# Patient Record
Sex: Female | Born: 1955 | Race: White | Hispanic: No | Marital: Married | Smoking: Never smoker
Health system: Southern US, Community
[De-identification: ages and names within clinical notes are randomized; demographics above are authoritative.]

## PROBLEM LIST (undated history)

## (undated) DIAGNOSIS — E669 Obesity, unspecified: Secondary | ICD-10-CM

## (undated) DIAGNOSIS — N393 Stress incontinence (female) (male): Secondary | ICD-10-CM

## (undated) DIAGNOSIS — D649 Anemia, unspecified: Secondary | ICD-10-CM

## (undated) DIAGNOSIS — Z803 Family history of malignant neoplasm of breast: Secondary | ICD-10-CM

## (undated) DIAGNOSIS — E119 Type 2 diabetes mellitus without complications: Secondary | ICD-10-CM

## (undated) DIAGNOSIS — C4490 Unspecified malignant neoplasm of skin, unspecified: Secondary | ICD-10-CM

## (undated) DIAGNOSIS — L570 Actinic keratosis: Secondary | ICD-10-CM

## (undated) HISTORY — PX: COLONOSCOPY: SHX174

## (undated) HISTORY — PX: SKIN BIOPSY: SHX1

## (undated) HISTORY — DX: Unspecified malignant neoplasm of skin, unspecified: C44.90

## (undated) HISTORY — DX: Anemia, unspecified: D64.9

## (undated) HISTORY — PX: CRYOTHERAPY: SHX1416

## (undated) HISTORY — PX: ESOPHAGOGASTRODUODENOSCOPY: SHX1529

## (undated) HISTORY — DX: Actinic keratosis: L57.0

## (undated) HISTORY — DX: Obesity, unspecified: E66.9

## (undated) HISTORY — PX: TUBAL LIGATION: SHX77

## (undated) HISTORY — DX: Type 2 diabetes mellitus without complications: E11.9

## (undated) HISTORY — DX: Stress incontinence (female) (male): N39.3

## (undated) HISTORY — DX: Family history of malignant neoplasm of breast: Z80.3

---

## 2004-10-13 ENCOUNTER — Other Ambulatory Visit: Payer: Self-pay

## 2004-10-14 ENCOUNTER — Ambulatory Visit: Payer: Self-pay | Admitting: Unknown Physician Specialty

## 2009-11-30 ENCOUNTER — Ambulatory Visit: Payer: Self-pay | Admitting: Internal Medicine

## 2009-12-08 ENCOUNTER — Ambulatory Visit: Payer: Self-pay | Admitting: Internal Medicine

## 2009-12-31 ENCOUNTER — Ambulatory Visit: Payer: Self-pay | Admitting: Internal Medicine

## 2010-01-30 ENCOUNTER — Ambulatory Visit: Payer: Self-pay | Admitting: Internal Medicine

## 2010-03-02 ENCOUNTER — Ambulatory Visit: Payer: Self-pay | Admitting: Internal Medicine

## 2010-04-01 ENCOUNTER — Ambulatory Visit: Payer: Self-pay | Admitting: Internal Medicine

## 2010-05-02 ENCOUNTER — Ambulatory Visit: Payer: Self-pay | Admitting: Internal Medicine

## 2010-06-29 ENCOUNTER — Ambulatory Visit: Payer: Self-pay | Admitting: Internal Medicine

## 2010-07-01 ENCOUNTER — Ambulatory Visit: Payer: Self-pay | Admitting: Internal Medicine

## 2010-08-10 ENCOUNTER — Ambulatory Visit: Payer: Self-pay | Admitting: Internal Medicine

## 2010-08-31 ENCOUNTER — Ambulatory Visit: Payer: Self-pay | Admitting: Internal Medicine

## 2010-10-06 ENCOUNTER — Ambulatory Visit: Payer: Self-pay | Admitting: Internal Medicine

## 2010-10-31 ENCOUNTER — Ambulatory Visit: Payer: Self-pay | Admitting: Internal Medicine

## 2010-12-01 ENCOUNTER — Ambulatory Visit: Payer: Self-pay | Admitting: Internal Medicine

## 2011-03-22 ENCOUNTER — Ambulatory Visit: Payer: Self-pay | Admitting: Internal Medicine

## 2011-04-02 ENCOUNTER — Ambulatory Visit: Payer: Self-pay | Admitting: Internal Medicine

## 2011-07-12 ENCOUNTER — Ambulatory Visit: Payer: Self-pay | Admitting: Oncology

## 2011-08-01 ENCOUNTER — Ambulatory Visit: Payer: Self-pay | Admitting: Oncology

## 2011-11-15 ENCOUNTER — Ambulatory Visit: Payer: Self-pay | Admitting: Oncology

## 2011-12-01 ENCOUNTER — Ambulatory Visit: Payer: Self-pay | Admitting: Oncology

## 2013-12-13 DIAGNOSIS — Z148 Genetic carrier of other disease: Secondary | ICD-10-CM | POA: Insufficient documentation

## 2013-12-13 DIAGNOSIS — I10 Essential (primary) hypertension: Secondary | ICD-10-CM | POA: Insufficient documentation

## 2013-12-26 ENCOUNTER — Ambulatory Visit: Payer: Self-pay | Admitting: Oncology

## 2014-01-16 ENCOUNTER — Ambulatory Visit: Payer: Self-pay | Admitting: Oncology

## 2014-01-30 ENCOUNTER — Ambulatory Visit: Payer: Self-pay | Admitting: Oncology

## 2014-02-14 LAB — HM PAP SMEAR: HM Pap smear: NEGATIVE

## 2014-03-02 ENCOUNTER — Ambulatory Visit: Payer: Self-pay | Admitting: Oncology

## 2014-07-11 ENCOUNTER — Ambulatory Visit: Payer: Self-pay | Admitting: Gastroenterology

## 2017-03-30 ENCOUNTER — Ambulatory Visit: Payer: Self-pay | Admitting: Obstetrics & Gynecology

## 2017-04-05 ENCOUNTER — Ambulatory Visit (INDEPENDENT_AMBULATORY_CARE_PROVIDER_SITE_OTHER): Payer: 59 | Admitting: Obstetrics & Gynecology

## 2017-04-05 ENCOUNTER — Encounter: Payer: Self-pay | Admitting: Obstetrics & Gynecology

## 2017-04-05 VITALS — BP 120/80 | HR 69 | Ht 61.0 in | Wt 322.0 lb

## 2017-04-05 DIAGNOSIS — Z124 Encounter for screening for malignant neoplasm of cervix: Secondary | ICD-10-CM | POA: Diagnosis not present

## 2017-04-05 DIAGNOSIS — Z1211 Encounter for screening for malignant neoplasm of colon: Secondary | ICD-10-CM | POA: Diagnosis not present

## 2017-04-05 DIAGNOSIS — Z1239 Encounter for other screening for malignant neoplasm of breast: Secondary | ICD-10-CM

## 2017-04-05 DIAGNOSIS — Z1231 Encounter for screening mammogram for malignant neoplasm of breast: Secondary | ICD-10-CM | POA: Diagnosis not present

## 2017-04-05 DIAGNOSIS — Z0000 Encounter for general adult medical examination without abnormal findings: Secondary | ICD-10-CM | POA: Insufficient documentation

## 2017-04-05 DIAGNOSIS — Z01419 Encounter for gynecological examination (general) (routine) without abnormal findings: Secondary | ICD-10-CM

## 2017-04-05 LAB — HM PAP SMEAR: HM PAP: NORMAL

## 2017-04-05 LAB — HM MAMMOGRAPHY

## 2017-04-05 NOTE — Patient Instructions (Signed)
PAP every three years Mammogram every year    Call [phone] to schedule at [facility] every 10 years Labs yearly (with PCP)

## 2017-04-05 NOTE — Progress Notes (Signed)
HPI:      [name] is a 61 y.o. [phone] who LMP was in the past, she presents today for her annual examination.  The patient has no complaints today. The patient is not sexually active. Herlast pap: approximate date 2015 and was normal and last mammogram: approximate date 2017 and was normal.  The patient does perform self breast exams.  There is notable family history of breast or ovarian cancer in her family. She has tested NEG for BRCA and other genes. The patient is not currently taking hormone replacement therapy. Patient denies post-menopausal vaginal bleeding.   The patient has regular exercise: no. The patient denies current symptoms of depression.   Pt has some urinary freq and urgency, feels it is from diuretic  GYN Hx: Last Colonoscopy:2 years ago. Normal.  Last DEXA: 3 years ago.    PMHx: DM HTN Obesity Anemia PSH: Ablation BTL FH: Breast Uterine Cancer Social History   Tobacco Use  . Smoking status: Never Smoker  . Smokeless tobacco: Never Used  Substance Use Topics  . Alcohol use: No    Frequency: Never  . Drug use: No    Current Outpatient Medications:  .  allopurinol (ZYLOPRIM) 100 MG tablet, TAKE 1 TABLET BY MOUTH TWO  TIMES DAILY, Disp: , Rfl:  .  atenolol (TENORMIN) 50 MG tablet, TAKE 1 TABLET BY MOUTH  DAILY, Disp: , Rfl:  .  lisinopril-hydrochlorothiazide (PRINZIDE,ZESTORETIC) 10-12.5 MG tablet, TAKE 1 TABLET BY MOUTH  DAILY, Disp: , Rfl:  .  fluorouracil (EFUDEX) 5 % cream, , Disp: , Rfl:  .  ibuprofen (ADVIL,MOTRIN) 200 MG tablet, Take by mouth., Disp: , Rfl:  Allergies: Patient has no known allergies.  Review of Systems  Constitutional: Negative for chills, fever and malaise/fatigue.  HENT: Negative for congestion, sinus pain and sore throat.   Eyes: Negative for blurred vision and pain.  Respiratory: Negative for cough and wheezing.   Cardiovascular: Negative for chest pain and leg swelling.  Gastrointestinal: Negative for abdominal  pain, constipation, diarrhea, heartburn, nausea and vomiting.  Genitourinary: Negative for dysuria, frequency, hematuria and urgency.  Musculoskeletal: Negative for back pain, joint pain, myalgias and neck pain.  Skin: Negative for itching and rash.  Neurological: Negative for dizziness, tremors and weakness.  Endo/Heme/Allergies: Does not bruise/bleed easily.  Psychiatric/Behavioral: Negative for depression. The patient is not nervous/anxious and does not have insomnia.     Objective: BP 120/80   Pulse 69   Ht _0  (1.549 m)   Wt (!) 322 lb (146.1 kg)   BMI 60.84 kg/m   Filed Weights   04/05/17 0859  Weight: (!) 322 lb (146.1 kg)   Body mass index is 60.84 kg/m. Physical Exam  Constitutional: She is oriented to person, place, and time. She appears well-developed and well-nourished. No distress.  Obesity limits exam  Genitourinary: Rectum normal, vagina normal and uterus normal. Pelvic exam was performed with patient supine. There is no rash or lesion on the right labia. There is no rash or lesion on the left labia. Vagina exhibits no lesion. No bleeding in the vagina. Right adnexum does not display mass and does not display tenderness. Left adnexum does not display mass and does not display tenderness. Cervix does not exhibit motion tenderness, lesion, friability or polyp.   Uterus is mobile and midaxial. Uterus is not enlarged or exhibiting a mass.  HENT:  Head: Normocephalic and atraumatic. Head is without laceration.  Right Ear: Hearing normal.  Left Ear: Hearing  normal.  Nose: No epistaxis.  No foreign bodies.  Mouth/Throat: Uvula is midline, oropharynx is clear and moist and mucous membranes are normal.  Eyes: Pupils are equal, round, and reactive to light.  Neck: Normal range of motion. Neck supple. No thyromegaly present.  Cardiovascular: Normal rate and regular rhythm. Exam reveals no gallop and no friction rub.  No murmur heard. Pulmonary/Chest: Effort normal and  breath sounds normal. No respiratory distress. She has no wheezes. Right breast exhibits no mass, no skin change and no tenderness. Left breast exhibits no mass, no skin change and no tenderness.  Abdominal: Soft. Bowel sounds are normal. She exhibits no distension. There is no tenderness. There is no rebound.  Musculoskeletal: Normal range of motion.  Neurological: She is alert and oriented to person, place, and time. No cranial nerve deficit.  Skin: Skin is warm and dry.  Psychiatric: She has a normal mood and affect. Judgment normal.  Vitals reviewed.   Assessment: Annual Exam 1. Annual physical exam   2. Screening for cervical cancer   3. Screen for colon cancer   4. Screening for breast cancer   5. Morbid obesity ([place])    Plan:            1.  Cervical Screening-  Pap smear done today  2. Breast screening- Exam annually and mammogram scheduled  3. Colonoscopy every 10 years, Hemoccult testing after age 59  4. Labs managed by PCP  5. Counseling for hormonal therapy: none, no change in therapy today  6. Monitor OAB sx's    F/U  Return in about 1 year (around 04/05/2018) for Annual.  Barnett Applebaum, MD, [facility], [facility] 04/05/2017  9:24 AM

## 2017-04-07 LAB — IGP, APTIMA HPV
HPV APTIMA: NEGATIVE
PAP SMEAR COMMENT: 0

## 2017-04-25 LAB — FECAL OCCULT BLOOD, IMMUNOCHEMICAL: Fecal Occult Bld: NEGATIVE

## 2017-08-30 DIAGNOSIS — C4491 Basal cell carcinoma of skin, unspecified: Secondary | ICD-10-CM

## 2017-08-30 HISTORY — DX: Basal cell carcinoma of skin, unspecified: C44.91

## 2018-04-09 ENCOUNTER — Ambulatory Visit (INDEPENDENT_AMBULATORY_CARE_PROVIDER_SITE_OTHER): Payer: Managed Care, Other (non HMO) | Admitting: Advanced Practice Midwife

## 2018-04-09 ENCOUNTER — Encounter: Payer: Self-pay | Admitting: Advanced Practice Midwife

## 2018-04-09 VITALS — BP 132/88 | Ht 61.0 in | Wt 291.0 lb

## 2018-04-09 DIAGNOSIS — Z0000 Encounter for general adult medical examination without abnormal findings: Secondary | ICD-10-CM

## 2018-04-09 DIAGNOSIS — Z01419 Encounter for gynecological examination (general) (routine) without abnormal findings: Secondary | ICD-10-CM | POA: Diagnosis not present

## 2018-04-09 NOTE — Patient Instructions (Signed)
American Heart Association (AHA) Exercise Recommendation  Being physically active is important to prevent heart disease and stroke, the nation's No. 1and No. 5killers. To improve overall cardiovascular health, we suggest at least 150 minutes per week of moderate exercise or 75 minutes per week of vigorous exercise (or a combination of moderate and vigorous activity). Thirty minutes a day, five times a week is an easy goal to remember. You will also experience benefits even if you divide your time into two or three segments of 10 to 15 minutes per day.  For people who would benefit from lowering their blood pressure or cholesterol, we recommend 40 minutes of aerobic exercise of moderate to vigorous intensity three to four times a week to lower the risk for heart attack and stroke.  Physical activity is anything that makes you move your body and burn calories.  This includes things like climbing stairs or playing sports. Aerobic exercises benefit your heart, and include walking, jogging, swimming or biking. Strength and stretching exercises are best for overall stamina and flexibility.  The simplest, positive change you can make to effectively improve your heart health is to start walking. It's enjoyable, free, easy, social and great exercise. A walking program is flexible and boasts high success rates because people can stick with it. It's easy for walking to become a regular and satisfying part of life.   For Overall Cardiovascular Health:  At least 30 minutes of moderate-intensity aerobic activity at least 5 days per week for a total of 150  OR   At least 25 minutes of vigorous aerobic activity at least 3 days per week for a total of 75 minutes; or a combination of moderate- and vigorous-intensity aerobic activity  AND   Moderate- to high-intensity muscle-strengthening activity at least 2 days per week for additional health benefits.  For Lowering Blood Pressure and Cholesterol  An  average 40 minutes of moderate- to vigorous-intensity aerobic activity 3 or 4 times per week  What if I can't make it to the time goal? Something is always better than nothing! And everyone has to start somewhere. Even if you've been sedentary for years, today is the day you can begin to make healthy changes in your life. If you don't think you'll make it for 30 or 40 minutes, set a reachable goal for today. You can work up toward your overall goal by increasing your time as you get stronger. Don't let all-or-nothing thinking rob you of doing what you can every day.  Source:http://www.heart.org   Mediterranean Diet A Mediterranean diet refers to food and lifestyle choices that are based on the traditions of countries located on the [employer]. This way of eating has been shown to help prevent certain conditions and improve outcomes for people who have chronic diseases, like kidney disease and heart disease. What are tips for following this plan? Lifestyle  Cook and eat meals together with your family, when possible.  Drink enough fluid to keep your urine clear or pale yellow.  Be physically active every day. This includes: ? Aerobic exercise like running or swimming. ? Leisure activities like gardening, walking, or housework.  Get 7-8 hours of sleep each night.  If recommended by your health care provider, drink red wine in moderation. This means 1 glass a day for nonpregnant women and 2 glasses a day for men. A glass of wine equals 5 oz (150 mL). Reading food labels  Check the serving size of packaged foods. For foods such as rice  and pasta, the serving size refers to the amount of cooked product, not dry.  Check the total fat in packaged foods. Avoid foods that have saturated fat or trans fats.  Check the ingredients list for added sugars, such as corn syrup. Shopping  At the grocery store, buy most of your food from the areas near the walls of the store. This  includes: ? Fresh fruits and vegetables (produce). ? Grains, beans, nuts, and seeds. Some of these may be available in unpackaged forms or large amounts (in bulk). ? Fresh seafood. ? Poultry and eggs. ? Low-fat dairy products.  Buy whole ingredients instead of prepackaged foods.  Buy fresh fruits and vegetables in-season from local farmers markets.  Buy frozen fruits and vegetables in resealable bags.  If you do not have access to quality fresh seafood, buy precooked frozen shrimp or canned fish, such as tuna, salmon, or sardines.  Buy small amounts of raw or cooked vegetables, salads, or olives from the deli or salad bar at your store.  Stock your pantry so you always have certain foods on hand, such as olive oil, canned tuna, canned tomatoes, rice, pasta, and beans. Cooking  Cook foods with extra-virgin olive oil instead of using butter or other vegetable oils.  Have meat as a side dish, and have vegetables or grains as your main dish. This means having meat in small portions or adding small amounts of meat to foods like pasta or stew.  Use beans or vegetables instead of meat in common dishes like chili or lasagna.  Experiment with different cooking methods. Try roasting or broiling vegetables instead of steaming or sauteing them.  Add frozen vegetables to soups, stews, pasta, or rice.  Add nuts or seeds for added healthy fat at each meal. You can add these to yogurt, salads, or vegetable dishes.  Marinate fish or vegetables using olive oil, lemon juice, garlic, and fresh herbs. Meal planning  Plan to eat 1 vegetarian meal one day each week. Try to work up to 2 vegetarian meals, if possible.  Eat seafood 2 or more times a week.  Have healthy snacks readily available, such as: ? Vegetable sticks with hummus. ? [place] yogurt. ? Fruit and nut trail mix.  Eat balanced meals throughout the week. This includes: ? Fruit: 2-3 servings a day ? Vegetables: 4-5 servings a  day ? Low-fat dairy: 2 servings a day ? Fish, poultry, or lean meat: 1 serving a day ? Beans and legumes: 2 or more servings a week ? Nuts and seeds: 1-2 servings a day ? Whole grains: 6-8 servings a day ? Extra-virgin olive oil: 3-4 servings a day  Limit red meat and sweets to only a few servings a month What are my food choices?  Mediterranean diet ? Recommended ? Grains: Whole-grain pasta. Brown rice. Bulgar wheat. Polenta. Couscous. Whole-wheat bread. [place]. ? Vegetables: Artichokes. Beets. Broccoli. Cabbage. Carrots. Eggplant. Green beans. Chard. Kale. Spinach. Onions. Leeks. Peas. Squash. Tomatoes. Peppers. Radishes. ? Fruits: Apples. Apricots. Avocado. Berries. Bananas. Cherries. Dates. Figs. Grapes. Lemons. Melon. Oranges. Peaches. Plums. Pomegranate. ? Meats and other protein foods: Beans. Almonds. Sunflower seeds. Pine nuts. Peanuts. [place]. Salmon. Scallops. Shrimp. [place]. Tilapia. Clams. Oysters. Eggs. ? Dairy: Low-fat milk. Cheese. Greek yogurt. ? Beverages: Water. Red wine. Herbal tea. ? Fats and oils: Extra virgin olive oil. Avocado oil. Grape seed oil. ? Sweets and desserts: [place] yogurt with honey. Baked apples. Poached pears. Trail mix. ? Seasoning and other foods: Basil. Cilantro. Coriander.  Cumin. Mint. Parsley. Sage. Rosemary. Tarragon. Garlic. Oregano. Thyme. Pepper. Balsalmic vinegar. Tahini. Hummus. Tomato sauce. Olives. Mushrooms. ? Limit these ? Grains: Prepackaged pasta or rice dishes. Prepackaged cereal with added sugar. ? Vegetables: Deep fried potatoes (french fries). ? Fruits: Fruit canned in syrup. ? Meats and other protein foods: Beef. Pork. Lamb. Poultry with skin. Hot dogs. [place] Salines. ? Dairy: Ice cream. Sour cream. Whole milk. ? Beverages: Juice. Sugar-sweetened soft drinks. Beer. Liquor and spirits. ? Fats and oils: Butter. Canola oil. Vegetable oil. Beef fat (tallow). Lard. ? Sweets and desserts: Cookies. Cakes. Pies. Candy. ? Seasoning and other  foods: Mayonnaise. Premade sauces and marinades. ? The items listed may not be a complete list. Talk with your dietitian about what dietary choices are right for you. Summary  The Mediterranean diet includes both food and lifestyle choices.  Eat a variety of fresh fruits and vegetables, beans, nuts, seeds, and whole grains.  Limit the amount of red meat and sweets that you eat.  Talk with your health care provider about whether it is safe for you to drink red wine in moderation. This means 1 glass a day for nonpregnant women and 2 glasses a day for men. A glass of wine equals 5 oz (150 mL). This information is not intended to replace advice given to you by your health care provider. Make sure you discuss any questions you have with your health care provider. Document Released: 12/10/2015 Document Revised: 01/12/2016 Document Reviewed: 12/10/2015 Elsevier Interactive Patient Education  2018 [place] Years, Female Preventive care refers to lifestyle choices and visits with your health care provider that can promote health and wellness. What does preventive care include?  A yearly physical exam. This is also called an annual well check.  Dental exams once or twice a year.  Routine eye exams. Ask your health care provider how often you should have your eyes checked.  Personal lifestyle choices, including: ? Daily care of your teeth and gums. ? Regular physical activity. ? Eating a healthy diet. ? Avoiding tobacco and drug use. ? Limiting alcohol use. ? Practicing safe sex. ? Taking low-dose aspirin daily starting at age 71. ? Taking vitamin and mineral supplements as recommended by your health care provider. What happens during an annual well check? The services and screenings done by your health care provider during your annual well check will depend on your age, overall health, lifestyle risk factors, and family history of disease. Counseling Your health  care provider may ask you questions about your:  Alcohol use.  Tobacco use.  Drug use.  Emotional well-being.  Home and relationship well-being.  Sexual activity.  Eating habits.  Work and work Statistician.  Method of birth control.  Menstrual cycle.  Pregnancy history.  Screening You may have the following tests or measurements:  Height, weight, and BMI.  Blood pressure.  Lipid and cholesterol levels. These may be checked every 5 years, or more frequently if you are over 55 years old.  Skin check.  Lung cancer screening. You may have this screening every year starting at age 34 if you have a 30-pack-year history of smoking and currently smoke or have quit within the past 15 years.  Fecal occult blood test (FOBT) of the stool. You may have this test every year starting at age 28.  Flexible sigmoidoscopy or colonoscopy. You may have a sigmoidoscopy every 5 years or a colonoscopy every 10 years starting at age 82.  Hepatitis C blood test.  Hepatitis B blood test.  Sexually transmitted disease (STD) testing.  Diabetes screening. This is done by checking your blood sugar (glucose) after you have not eaten for a while (fasting). You may have this done every 1-3 years.  Mammogram. This may be done every 1-2 years. Talk to your health care provider about when you should start having regular mammograms. This may depend on whether you have a family history of breast cancer.  BRCA-related cancer screening. This may be done if you have a family history of breast, ovarian, tubal, or peritoneal cancers.  Pelvic exam and Pap test. This may be done every 3 years starting at age 56. Starting at age 51, this may be done every 5 years if you have a Pap test in combination with an HPV test.  Bone density scan. This is done to screen for osteoporosis. You may have this scan if you are at high risk for osteoporosis.  Discuss your test results, treatment options, and if necessary,  the need for more tests with your health care provider. Vaccines Your health care provider may recommend certain vaccines, such as:  Influenza vaccine. This is recommended every year.  Tetanus, diphtheria, and acellular pertussis (Tdap, Td) vaccine. You may need a Td booster every 10 years.  Varicella vaccine. You may need this if you have not been vaccinated.  Zoster vaccine. You may need this after age 23.  Measles, mumps, and rubella (MMR) vaccine. You may need at least one dose of MMR if you were born in 1957 or later. You may also need a second dose.  Pneumococcal 13-valent conjugate (PCV13) vaccine. You may need this if you have certain conditions and were not previously vaccinated.  Pneumococcal polysaccharide (PPSV23) vaccine. You may need one or two doses if you smoke cigarettes or if you have certain conditions.  Meningococcal vaccine. You may need this if you have certain conditions.  Hepatitis A vaccine. You may need this if you have certain conditions or if you travel or work in places where you may be exposed to hepatitis A.  Hepatitis B vaccine. You may need this if you have certain conditions or if you travel or work in places where you may be exposed to hepatitis B.  Haemophilus influenzae type b (Hib) vaccine. You may need this if you have certain conditions.  Talk to your health care provider about which screenings and vaccines you need and how often you need them. This information is not intended to replace advice given to you by your health care provider. Make sure you discuss any questions you have with your health care provider. Document Released: 05/15/2015 Document Revised: 01/06/2016 Document Reviewed: 02/17/2015 Elsevier Interactive Patient Education  [employer].

## 2018-04-09 NOTE — Progress Notes (Signed)
Patient ID: Rose Mendez, female   DOB: 1955-07-24, 62 y.o.   MRN: [id number]      Gynecology Annual Exam  PCP: Juluis Pitch, MD  Chief Complaint:  Chief Complaint  Patient presents with  . Annual Exam    History of Present Illness:Patient is a 62 y.o. [id number] presents for annual exam. The patient has no complaints today. She has increased her healthy lifestyle in the past year and is down 31 pounds from then. She is following a decreased carb diet and she walks on the treadmill daily at work. She also does water aerobics 2x per week. She notices an improvement in how she feels since making healthy lifestyle changes. She just had her mammogram this morning.   LMP: No LMP recorded. Postmenopausal She denies any bleeding Postcoital Bleeding: not applicable   The patient is not sexually active. She denies dyspareunia.  The patient does perform self breast exams.  There is notable family history of breast or ovarian cancer in her family. Her maternal aunt was diagnosed with breast cancer in her 46s. She has 2 other maternal aunts with breast cancer diagnosed in their 17s. The patient tested negative for BRCA gene last year.   The patient wears seatbelts: yes.   The patient has regular exercise: yes.    The patient denies current symptoms of depression.     Review of Systems: Review of Systems  Constitutional: Negative.   HENT: Negative.   Eyes: Negative.   Respiratory: Negative.   Cardiovascular: Negative.   Gastrointestinal: Negative.   Genitourinary: Negative.   Musculoskeletal: Negative.   Skin: Negative.   Neurological: Negative.   Endo/Heme/Allergies: Negative.   Psychiatric/Behavioral: Negative.     Past Medical History:  Past Medical History:  Diagnosis Date  . Anemia   . Diabetes type 2, controlled ([place])   . Obesity   . Skin cancer   . Stress incontinence     Past Surgical History:  Past Surgical History:  Procedure Laterality Date  . COLONOSCOPY    .  CRYOTHERAPY    . ESOPHAGOGASTRODUODENOSCOPY    . SKIN BIOPSY    . TUBAL LIGATION      Gynecologic History:  No LMP recorded. Last Pap: 1 year ago Results were:  no abnormalities  Last mammogram: 1 year ago Results were: BI-RAD I per patient report She reports a normal bone density exam in the past  Obstetric History: [id number]  Family History:  Family History  Problem Relation Age of Onset  . Uterine cancer Mother   . Breast cancer Maternal Aunt   . Lung cancer Maternal Aunt     Social History:  Social History   Socioeconomic History  . Marital status: Married    Spouse name: Not on file  . Number of children: Not on file  . Years of education: Not on file  . Highest education level: Not on file  Occupational History  . Not on file  Social Needs  . Financial resource strain: Not on file  . Food insecurity:    Worry: Not on file    Inability: Not on file  . Transportation needs:    Medical: Not on file    Non-medical: Not on file  Tobacco Use  . Smoking status: Never Smoker  . Smokeless tobacco: Never Used  Substance and Sexual Activity  . Alcohol use: No    Frequency: Never  . Drug use: No  . Sexual activity: Not Currently    Birth control/protection: None  Lifestyle  . Physical activity:    Days per week: Not on file    Minutes per session: Not on file  . Stress: Not on file  Relationships  . Social connections:    Talks on phone: Not on file    Gets together: Not on file    Attends religious service: Not on file    Active member of club or organization: Not on file    Attends meetings of clubs or organizations: Not on file    Relationship status: Not on file  . Intimate partner violence:    Fear of current or ex partner: Not on file    Emotionally abused: Not on file    Physically abused: Not on file    Forced sexual activity: Not on file  Other Topics Concern  . Not on file  Social History Narrative  . Not on file    Allergies:  No Known  Allergies  Medications: Prior to Admission medications   Medication Sig Start Date End Date Taking? Authorizing Provider  atenolol (TENORMIN) 50 MG tablet TAKE 1 TABLET BY MOUTH  DAILY 12/06/16  Yes [provider]  lisinopril-hydrochlorothiazide (PRINZIDE,ZESTORETIC) 10-12.5 MG tablet TAKE 1 TABLET BY MOUTH  DAILY 03/27/17  Yes [provider]  allopurinol (ZYLOPRIM) 100 MG tablet TAKE 1 TABLET BY MOUTH TWO  TIMES DAILY 02/29/16   [provider]  fluorouracil (EFUDEX) 5 % cream  01/03/17   [provider]  ibuprofen (ADVIL,MOTRIN) 200 MG tablet Take by mouth.    [provider]    Physical Exam Vitals: Blood pressure 132/88, height '5\' 1"'  (1.549 m), weight 291 lb (132 kg).  General: NAD HEENT: normocephalic, anicteric Thyroid: no enlargement, no palpable nodules Pulmonary: No increased work of breathing, CTAB Cardiovascular: RRR, distal pulses 2+ Breast: Breast symmetrical, no tenderness, no palpable nodules or masses, no skin or nipple retraction present, no nipple discharge.  No axillary or supraclavicular lymphadenopathy. Abdomen: NABS, soft, non-tender, non-distended.  Umbilicus without lesions.  No hepatomegaly, splenomegaly or masses palpable. No evidence of hernia  Genitourinary: deferred for no concerns/PAP interval/shared decision making Extremities: no edema, erythema, or tenderness Neurologic: Grossly intact Psychiatric: mood appropriate, affect full     Assessment: 62 y.o. [id number] routine annual exam  Plan: Problem List Items Addressed This Visit    None    Visit Diagnoses    Well woman exam without gynecological exam    -  Primary      1) Mammogram - recommend yearly screening mammogram.  Mammogram Is up to date  2) STI screening  was offered and declined  3) ASCCP guidelines and rational discussed.  Patient opts for every 5 years screening interval  4) Osteoporosis  - per USPTF routine screening DEXA at age  32   Consider FDA-approved medical therapies in postmenopausal women and men aged 31 years and older, based on the following: a) A hip or vertebral (clinical or morphometric) fracture b) T-score ? -2.5 at the femoral neck or spine after appropriate evaluation to exclude secondary causes C) Low bone mass (T-score between -1.0 and -2.5 at the femoral neck or spine) and a 10-year probability of a hip fracture ? 3% or a 10-year probability of a major osteoporosis-related fracture ? 20% based on the [place]-adapted WHO algorithm   5) Routine healthcare maintenance including cholesterol, diabetes screening discussed managed by PCP  6) Colonoscopy: normal colonoscopy 1 or 2 years ago.  Screening recommended starting at age 5 for average risk individuals,  age 64 for individuals deemed at increased risk (including African Americans) and recommended to continue until age 2.  For patient age 39-85 individualized approach is recommended.  Gold standard screening is via colonoscopy, Cologuard screening is an acceptable alternative for patient unwilling or unable to undergo colonoscopy.  "Colorectal cancer screening for average?risk adults: 2018 guideline update from the [place]: A Cancer Journal for Clinicians: Sep 28, 2016   7) Return in about 1 year (around 04/10/2019) for annual established gyn.    Rod Can, CNM [facility], [facility] 04/09/2018, 10:35 AM

## 2018-04-11 ENCOUNTER — Encounter: Payer: Self-pay | Admitting: Obstetrics and Gynecology

## 2019-04-09 ENCOUNTER — Other Ambulatory Visit: Payer: Self-pay

## 2019-04-09 DIAGNOSIS — Z20822 Contact with and (suspected) exposure to covid-19: Secondary | ICD-10-CM

## 2019-04-11 LAB — NOVEL CORONAVIRUS, NAA: SARS-CoV-2, NAA: DETECTED — AB

## 2019-04-12 ENCOUNTER — Ambulatory Visit: Payer: Managed Care, Other (non HMO) | Admitting: Obstetrics & Gynecology

## 2019-05-10 ENCOUNTER — Other Ambulatory Visit: Payer: Self-pay

## 2019-05-10 ENCOUNTER — Encounter: Payer: Self-pay | Admitting: Obstetrics & Gynecology

## 2019-05-10 ENCOUNTER — Ambulatory Visit (INDEPENDENT_AMBULATORY_CARE_PROVIDER_SITE_OTHER): Payer: Managed Care, Other (non HMO) | Admitting: Obstetrics & Gynecology

## 2019-05-10 VITALS — BP 120/80 | Ht 61.5 in | Wt 311.0 lb

## 2019-05-10 DIAGNOSIS — Z1231 Encounter for screening mammogram for malignant neoplasm of breast: Secondary | ICD-10-CM

## 2019-05-10 DIAGNOSIS — Z1211 Encounter for screening for malignant neoplasm of colon: Secondary | ICD-10-CM

## 2019-05-10 DIAGNOSIS — Z01419 Encounter for gynecological examination (general) (routine) without abnormal findings: Secondary | ICD-10-CM | POA: Diagnosis not present

## 2019-05-10 NOTE — Progress Notes (Signed)
HPI:      [name] is a 64 y.o. [phone] who LMP was in the past, she presents today for her annual examination.  The patient has no complaints today. The patient is not currently sexually active.  Husband passed away May 05, 2019 from [place].  Son died 12-03-18.  SO tress this year.   Herlast pap: approximate date 2018 and was normal and last mammogram: approximate date 2019 and was normal.  The patient does perform self breast exams.  There is notable family history of uterine cancer in her family.  BRCA and LYNCH Neg. The patient is not taking hormone replacement therapy. Patient denies post-menopausal vaginal bleeding.   The patient has regular exercise: yes. The patient denies current symptoms of depression.    GYN Hx: Last Colonoscopy:3 years ago. Normal.  Last DEXA: never ago.    PMHx: Past Medical History:  Diagnosis Date  . Anemia   . Diabetes type 2, controlled ([place])   . Family history of breast cancer    12/19 pt states she and aunts are BRCA neg  . Obesity   . Skin cancer   . Stress incontinence    Past Surgical History:  Procedure Laterality Date  . COLONOSCOPY    . CRYOTHERAPY    . ESOPHAGOGASTRODUODENOSCOPY    . SKIN BIOPSY    . TUBAL LIGATION     Family History  Problem Relation Age of Onset  . Uterine cancer Mother   . Breast cancer Maternal Aunt   . Lung cancer Maternal Aunt   . Breast cancer Maternal Aunt 62   Social History   Tobacco Use  . Smoking status: Never Smoker  . Smokeless tobacco: Never Used  Substance Use Topics  . Alcohol use: No  . Drug use: No    Current Outpatient Medications:  .  allopurinol (ZYLOPRIM) 100 MG tablet, TAKE 1 TABLET BY MOUTH TWO  TIMES DAILY, Disp: , Rfl:  .  atenolol (TENORMIN) 50 MG tablet, TAKE 1 TABLET BY MOUTH  DAILY, Disp: , Rfl:  .  ibuprofen (ADVIL,MOTRIN) 200 MG tablet, Take by mouth., Disp: , Rfl:  .  lisinopril-hydrochlorothiazide (PRINZIDE,ZESTORETIC) 10-12.5 MG tablet, TAKE 1 TABLET BY MOUTH  DAILY,  Disp: , Rfl:  .  fluorouracil (EFUDEX) 5 % cream, , Disp: , Rfl:  Allergies: Patient has no known allergies.  Review of Systems  Constitutional: Negative for chills, fever and malaise/fatigue.  HENT: Negative for congestion, sinus pain and sore throat.   Eyes: Negative for blurred vision and pain.  Respiratory: Negative for cough and wheezing.   Cardiovascular: Negative for chest pain and leg swelling.  Gastrointestinal: Negative for abdominal pain, constipation, diarrhea, heartburn, nausea and vomiting.  Genitourinary: Negative for dysuria, frequency, hematuria and urgency.  Musculoskeletal: Negative for back pain, joint pain, myalgias and neck pain.  Skin: Negative for itching and rash.  Neurological: Negative for dizziness, tremors and weakness.  Endo/Heme/Allergies: Does not bruise/bleed easily.  Psychiatric/Behavioral: Negative for depression. The patient is not nervous/anxious and does not have insomnia.     Objective: BP 120/80   Ht 5' 1.5" (1.562 m)   Wt (!) 311 lb (141.1 kg)   BMI 57.81 kg/m   Filed Weights   05/10/19 0855  Weight: (!) 311 lb (141.1 kg)   Body mass index is 57.81 kg/m. Physical Exam Constitutional:      General: She is not in acute distress.    Appearance: She is well-developed. She is obese.  Genitourinary:  Pelvic exam was performed with patient supine.     Vagina, uterus and rectum normal.     No lesions in the vagina.     No vaginal bleeding.     No cervical motion tenderness, friability, lesion or polyp.     Uterus is mobile.     Uterus is not enlarged.     No uterine mass detected.    Uterus is midaxial.     No right or left adnexal mass present.     Right adnexa not tender.     Left adnexa not tender.     Genitourinary Comments: Limited exam due to obesity  HENT:     Head: Normocephalic and atraumatic. No laceration.     Right Ear: Hearing normal.     Left Ear: Hearing normal.     Mouth/Throat:     Pharynx: Uvula midline.    Eyes:     Pupils: Pupils are equal, round, and reactive to light.  Neck:     Thyroid: No thyromegaly.  Cardiovascular:     Rate and Rhythm: Normal rate and regular rhythm.     Heart sounds: No murmur. No friction rub. No gallop.   Pulmonary:     Effort: Pulmonary effort is normal. No respiratory distress.     Breath sounds: Normal breath sounds. No wheezing.  Chest:     Breasts:        Right: No mass, skin change or tenderness.        Left: No mass, skin change or tenderness.  Abdominal:     General: Bowel sounds are normal. There is no distension.     Palpations: Abdomen is soft.     Tenderness: There is no abdominal tenderness. There is no rebound.  Musculoskeletal:        General: Normal range of motion.     Cervical back: Normal range of motion and neck supple.  Neurological:     Mental Status: She is alert and oriented to person, place, and time.     Cranial Nerves: No cranial nerve deficit.  Skin:    General: Skin is warm and dry.  Psychiatric:        Judgment: Judgment normal.  Vitals reviewed.     Assessment: Annual Exam 1. Women's annual routine gynecological examination   2. Encounter for screening mammogram for malignant neoplasm of breast   3. Screen for colon cancer     Plan:            1.  Cervical Screening-  Pap smear schedule reviewed with patient  2. Breast screening- Exam annually and mammogram scheduled  3. Colonoscopy every 10 years, Hemoccult testing after age 74  4. Labs managed by PCP  5. Counseling for hormonal therapy: none              6. FRAX - FRAX score for assessing the 10 year probability for fracture calculated and discussed today.  Based on age and score today, DEXA is not currently scheduled.    F/U  Return in about 1 year (around 05/09/2020) for Annual.  Barnett Applebaum, MD, [facility], [facility] 05/10/2019  9:28 AM

## 2019-05-10 NOTE — Patient Instructions (Addendum)
PAP every three years Mammogram every year    at [facility] Colonoscopy every 10 years Labs yearly (with PCP)

## 2019-05-16 LAB — FECAL OCCULT BLOOD, IMMUNOCHEMICAL: Fecal Occult Bld: NEGATIVE

## 2019-06-26 DIAGNOSIS — C4492 Squamous cell carcinoma of skin, unspecified: Secondary | ICD-10-CM

## 2019-06-26 DIAGNOSIS — D099 Carcinoma in situ, unspecified: Secondary | ICD-10-CM

## 2019-06-26 HISTORY — DX: Squamous cell carcinoma of skin, unspecified: C44.92

## 2019-06-26 HISTORY — DX: Carcinoma in situ, unspecified: D09.9

## 2019-07-13 DIAGNOSIS — D099 Carcinoma in situ, unspecified: Secondary | ICD-10-CM

## 2019-07-20 ENCOUNTER — Ambulatory Visit: Payer: Self-pay | Attending: Internal Medicine

## 2019-07-20 DIAGNOSIS — Z23 Encounter for immunization: Secondary | ICD-10-CM

## 2019-07-20 NOTE — Progress Notes (Signed)
   Covid-19 Vaccination Clinic  Name:  Rose Mendez    MRN: [id number] DOB: 1956/04/26  07/20/2019  Rose Mendez was observed post Covid-19 immunization for 15 minutes without incident. She was provided with Vaccine Information Sheet and instruction to access the V-Safe system.   Rose Mendez was instructed to call 911 with any severe reactions post vaccine: Marland Kitchen Difficulty breathing  . Swelling of face and throat  . A fast heartbeat  . A bad rash all over body  . Dizziness and weakness   Immunizations Administered    Name Date Dose VIS Date Route   Moderna COVID-19 Vaccine 07/20/2019  8:30 AM 0.5 mL 04/02/2019 Intramuscular   Manufacturer: Moderna   Lot: [id number]   [id number]

## 2019-07-26 ENCOUNTER — Ambulatory Visit: Payer: Managed Care, Other (non HMO) | Admitting: Dermatology

## 2019-07-26 ENCOUNTER — Encounter: Payer: Self-pay | Admitting: Dermatology

## 2019-07-26 ENCOUNTER — Other Ambulatory Visit: Payer: Self-pay

## 2019-07-26 DIAGNOSIS — L578 Other skin changes due to chronic exposure to nonionizing radiation: Secondary | ICD-10-CM

## 2019-07-26 DIAGNOSIS — C44629 Squamous cell carcinoma of skin of left upper limb, including shoulder: Secondary | ICD-10-CM | POA: Diagnosis not present

## 2019-07-26 DIAGNOSIS — D0461 Carcinoma in situ of skin of right upper limb, including shoulder: Secondary | ICD-10-CM | POA: Diagnosis not present

## 2019-07-26 DIAGNOSIS — C4491 Basal cell carcinoma of skin, unspecified: Secondary | ICD-10-CM

## 2019-07-26 DIAGNOSIS — D099 Carcinoma in situ, unspecified: Secondary | ICD-10-CM

## 2019-07-26 MED ORDER — CALCIPOTRIENE 0.005 % EX CREA: TOPICAL_CREAM | CUTANEOUS | 1 refills | Status: DC

## 2019-07-26 MED ORDER — FLUOROURACIL 5 % EX CREA: TOPICAL_CREAM | CUTANEOUS | 1 refills | Status: DC

## 2019-07-26 NOTE — Patient Instructions (Addendum)
Wound Care Instructions  On the day following your surgery, you should begin doing daily dressing changes: 1. Remove the old dressing and discard it. 2. Cleanse the wound gently with tap water. This may be done in the shower or by placing a wet gauze pad directly on the wound and letting it soak for several minutes. 3. It is important to gently remove any dried blood from the wound in order to encourage healing. This may be done by gently rolling a moistened Q-tip on the dried blood. Do not pick at the wound. 4. If the wound should start to bleed, continue cleaning the wound, then place a moist gauze pad on the wound and hold pressure for a few minutes.  5. Make sure you then dry the skin surrounding the wound completely or the tape will not stick to the skin. Do not use cotton balls on the wound. 6. After the wound is clean and dry, apply the ointment gently with a Q-tip. 7. Cut a non-stick pad to fit the size of the wound. Lay the pad flush to the wound. If the wound is draining, you may want to reinforce it with a small amount of gauze on top of the non-stick pad for a little added compression to the area. 8. Use the tape to seal the area completely. 9. Select from the following with respect to your individual situation: 1. If your wound has been stitched closed: continue the above steps 1-8 at least daily until your sutures are removed. 2. If your wound has been left open to heal: continue steps 1-8 at least daily for the first 3-4 weeks. 10. We would like for you to take a few extra precautions for at least the next week. 1. Sleep with your head elevated on pillows if our wound is on your head. 2. Do not bend over or lift heavy items to reduce the chance of elevated blood pressure to the wound 3. Do not participate in particularly strenuous activities.   Below is a list of dressing supplies you might need.  . Cotton-tipped applicators - Q-tips . Gauze pads (2x2 and/or 4x4) - All-Purpose  Sponges . Non-stick dressing material - Telfa . Tape - Paper or Hypafix . New and clean tube of petroleum jelly - Vaseline    Comments on Post-Operative Period 1. Slight swelling and redness often appear around the wound. This is normal and will disappear within several days following the surgery. 2. The healing wound will drain a brownish-red-yellow discharge during healing. This is a normal phase of wound healing. As the wound begins to heal, the drainage may increase in amount. Again, this drainage is normal. 3. Notify us if the drainage becomes persistently bloody, excessively swollen, or intensely painful or develops a foul odor or red streaks.  4. If you should experience mild discomfort during the healing phase, you may take an aspirin-free medication such as Tylenol (acetaminophen). Notify us if the discomfort is severe or persistent. Avoid alcoholic beverages when taking pain medicine.  In Case of Wound Hemorrhage A wound hemorrhage is when the bandage suddenly becomes soaked with bright red blood and flows profusely. If this happens, sit down or lie down with your head elevated. If the wound has a dressing on it, do not remove the dressing. Apply pressure to the existing gauze. If the wound is not covered, use a gauze pad to apply pressure and continue applying the pressure for 20 minutes without peeking. DO NOT COVER THE WOUND WITH   A LARGE TOWEL OR [place] CLOTH. Release your hand from the wound site but do not remove the dressing. If the bleeding has stopped, gently clean around the wound. Leave the dressing in place for 24 hours if possible. This wait time allows the blood vessels to close off so that you do not spark a new round of bleeding by disrupting the newly clotted blood vessels with an immediate dressing change. If the bleeding does not subside, continue to hold pressure. If matters are out of your control, contact an After Hours clinic or go to the Emergency Room.   Start  nicotinamide 500 mg each night and Heliocare Advanced each morning to reduce risk of skin cancer  Recommend broad spectrum SPF 30 or higher and photoprotection daily   5-FLUOROURACIL TREATMENT INSTRUCTIONS  Actinic keratoses are the dry, red scaly spots on the skin caused by sun damage. A portion of these spots can turn into skin cancer with time, and treating them can help prevent development of skin cancer.   Treatment of these spots requires removal of the defective skin cells. There are various ways to remove actinic keratoses, including freezing with liquid nitrogen, treatment with creams, or treatment with a blue light procedure in the office.   5-fluorouracil cream is a topical cream used to treat actinic keratoses. It works by interfering with the growth of abnormal fast-growing skin cells, such as actinic keratoses. These cells peel off and are replaced by healthy ones.   5-fluorouracil/calcipotriene is a combination of the 5-fluorouracil cream with a vitamin D analog cream called calcipotriene. The calcipotriene alone does not treat actinic keratoses. However, when it is combined with 5-fluorouracil, it helps the 5-fluorouracil treat the actinic keratoses much faster so that the same results can be achieved with a much shorter treatment time.  INSTRUCTIONS FOR 5-FLUOROURACIL/CALCIPOTRIENE CREAM:   5-fluorouracil/calcipotriene cream typically only needs to be used for 4-7 days. A thin layer should be applied twice a day to the treatment areas recommended by your physician.   If your physician prescribed you separate tubes of 5-fluourouracil and calcipotriene, apply a thin layer of 5-fluorouracil followed by a thin layer of calcipotriene.   Avoid contact with your eyes, nostrils, and mouth. Do not use 5-fluorouracil/calcipotriene cream on infected or open wounds.   You will develop redness, irritation and some crusting at areas where you have pre-cancer damage/actinic keratoses. IF YOU  DEVELOP PAIN, BLEEDING, OR SIGNIFICANT CRUSTING, STOP THE TREATMENT EARLY - you have already gotten a good response and the actinic keratoses should clear up well.  Wash your hands after applying 5-fluorouracil 5% cream on your skin.   A moisturizer or sunscreen with a minimum SPF 30 should be applied each morning.   Once you have finished the treatment, you can apply a thin layer of Vaseline twice a day to irritated areas to soothe and calm the areas more quickly. If you experience significant discomfort, contact your physician.  For some patients it is necessary to repeat the treatment for best results.  SIDE EFFECTS: When using 5-fluorouracil/calcipotriene cream, you may have mild irritation, such as redness, dryness, swelling, or a mild burning sensation. This usually resolves within 2 weeks. The more actinic keratoses you have, the more redness and inflammation you can expect during treatment. Eye irritation has been reported rarely. If this occurs, please let us know.  If you have any trouble using this cream, please call the office. If you have any other questions about this information, please do not hesitate  to ask me before you leave the office.

## 2019-07-26 NOTE — Progress Notes (Signed)
Follow-Up Visit   Subjective  Rose Mendez is a 64 y.o. female who presents for the following: Skin Cancer (1 month f/u R distal lat thigh, R arm, L shin) and Actinic Keratosis (1 month f/u arms, legs ).  She is scheduled for excision of SCC next week.   She has a BCC and 2 SCCis to treat today.  The following portions of the chart were reviewed this encounter and updated as appropriate: Tobacco  Allergies  Meds  Problems  Med Hx  Surg Hx  Fam Hx      Review of Systems: No other skin or systemic complaints.  Objective  Well appearing patient in no apparent distress; mood and affect are within normal limits.  A focused examination was performed including left shoulder, bilateral forearms and hands. Relevant physical exam findings are noted in the Assessment and Plan.  Objective  bilateral arms and hands: Diffuse scaly erythematous macules with underlying dyspigmentation.   Objective  Right dorsum of hand: Keratotic pink plaque.   Objective  Right Forearm - Anterior: Keratotic pink plaque.   Objective  Left shoulder: Pink pearly plaque with arborizing vessels.   Assessment & Plan  Actinic skin damage bilateral arms and hands  Extensive with a history of multiple non-melanoma skin cancers. Start Heliocare advanced each morning and nicotinamide 500 mg each night to reduce risk of skin cancer.  Plan 5FU/calcipotriene. Hold until after [facility] sites heal and after Mohs surgery performed. Risks and benefits reviewed. Expected reaction discussed.  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  Destruction of lesion  Destruction method: electrodesiccation and curettage   Informed consent: discussed and consent obtained   Timeout:  patient name, date of birth, surgical site, and procedure verified Procedure prep:   Patient was prepped and draped in usual sterile fashion Prep type:  Isopropyl alcohol Anesthesia: the lesion was anesthetized in a standard fashion   Anesthetic:  1% lidocaine w/ epinephrine 1-100,000 buffered w/ 8.4% NaHCO3 Curettage performed in three different directions: Yes   Electrodesiccation performed over the curetted area: Yes   Curettage cycles:  3 Lesion length (cm):  0.9 Lesion width (cm):  0.8 Margin per side (cm):  0.2 Final wound size (cm):  1.3 Hemostasis achieved with:  pressure and electrodesiccation Outcome: patient tolerated procedure well with no complications   Post-procedure details: wound care instructions given   Post-procedure details comment:  Mupirocin and pressure dressing applied  Right Forearm - Anterior  Destruction of lesion  Destruction method: electrodesiccation and curettage   Informed consent: discussed and consent obtained   Timeout:  patient name, date of birth, surgical site, and procedure verified Anesthesia: the lesion was anesthetized in a standard fashion   Anesthetic:  1% lidocaine w/ epinephrine 1-100,000 buffered w/ 8.4% NaHCO3 Curettage performed in three different directions: Yes   Electrodesiccation performed over the curetted area: Yes   Curettage cycles:  3 Lesion length (cm):  0.8 Lesion width (cm):  0.8 Margin per side (cm):  0.2 Final wound size (cm):  1.2 Hemostasis achieved with:  electrodesiccation Outcome: patient tolerated procedure well with no complications   Post-procedure details: wound care instructions given   Post-procedure details comment:  Mupirocin and a bandage applied  Superficial basal cell carcinoma (BCC) Left shoulder  Destruction of lesion  Destruction method: electrodesiccation and curettage   Informed consent:  discussed and consent obtained   Timeout:  patient name, date of birth, surgical site, and procedure verified Anesthesia: the lesion was anesthetized in a standard fashion   Anesthetic:   1% lidocaine w/ epinephrine 1-100,000 buffered w/ 8.4% NaHCO3 Curettage performed in three different directions: Yes   Electrodesiccation performed over the curetted area: Yes   Curettage cycles:  3 Lesion length (cm):  1.7 Lesion width (cm):  1.5 Margin per side (cm):  0.2 Final wound size (cm):  2.1 Hemostasis achieved with:  electrodesiccation Outcome: patient tolerated procedure well with no complications   Post-procedure details: wound care instructions given   Post-procedure details comment:  Mupirocin and pressure bandage applied  Return in about 5 days (around 07/31/2019) for surgery.   Documentation: I have reviewed the above documentation for accuracy and completeness, and I agree with the above.  Forest Gleason, MD

## 2019-07-31 ENCOUNTER — Other Ambulatory Visit: Payer: Self-pay

## 2019-07-31 ENCOUNTER — Ambulatory Visit: Payer: Managed Care, Other (non HMO) | Admitting: Dermatology

## 2019-07-31 DIAGNOSIS — C44529 Squamous cell carcinoma of skin of other part of trunk: Secondary | ICD-10-CM | POA: Diagnosis not present

## 2019-07-31 DIAGNOSIS — C4492 Squamous cell carcinoma of skin, unspecified: Secondary | ICD-10-CM

## 2019-07-31 MED ORDER — MUPIROCIN 2 % EX OINT: 1.0000 "application " | TOPICAL_OINTMENT | Freq: Every day | CUTANEOUS | 0 refills | Status: DC

## 2019-07-31 NOTE — Progress Notes (Signed)
   Follow-Up Visit   Subjective  Rose Mendez is a 64 y.o. female who presents for the following: Procedure (Excision of SCC on chest).   The following portions of the chart were reviewed this encounter and updated as appropriate: Tobacco  Allergies  Meds  Problems  Med Hx  Surg Hx  Fam Hx      Review of Systems: No other skin or systemic complaints.  Objective  Well appearing patient in no apparent distress; mood and affect are within normal limits.  A focused examination was performed including chest. Relevant physical exam findings are noted in the Assessment and Plan.  Objective  Right chest: Pink papule  Assessment & Plan  Squamous cell carcinoma of skin Right chest  Skin excision  Lesion length (cm):  0.8 Lesion width (cm):  0.8 Margin per side (cm):  0.5 Total excision diameter (cm):  1.8 Informed consent: discussed and consent obtained   Timeout: patient name, date of birth, surgical site, and procedure verified   Procedure prep:  Patient was prepped and draped in usual sterile fashion Prep type:  Chlorhexidine Anesthesia: the lesion was anesthetized in a standard fashion   Anesthetic:  1% lidocaine w/ epinephrine 1-100,000 nerve block (16cc) Instrument used comment:  15c Hemostasis achieved with: pressure and electrodesiccation   Outcome: patient tolerated procedure well with no complications    Skin repair Complexity:  Complex Final length (cm):  1.5 Informed consent: discussed and consent obtained   Timeout: patient name, date of birth, surgical site, and procedure verified   Procedure prep:  Patient was prepped and draped in usual sterile fashion Prep type:  Chlorhexidine Anesthesia: the lesion was anesthetized in a standard fashion   Anesthetic:  1% lidocaine w/ epinephrine 1-100,000 local infiltration Reason for type of repair: reduce tension to allow closure, reduce the risk of dehiscence, infection, and necrosis, allow closure of the large  defect, allow side-to-side closure without requiring a flap or graft and enhance both functionality and cosmetic results   Undermining: area extensively undermined   Subcutaneous layers (deep stitches):  Suture size:  3-0 Suture type: Vicryl (polyglactin 910)   Stitches:  Buried vertical mattress (pursestring closure) Fine/surface layer approximation (top stitches):  Suture size:  4-0 Suture type: Prolene (polypropylene)   Stitches: simple running   Hemostasis achieved with: suture, pressure and electrodesiccation Outcome: patient tolerated procedure well with no complications   Post-procedure details: wound care instructions given   Additional details:  Extensive undermining greater than the maximum width of the defect along at least one entire edge of the defect was performed Maximum width of defect perpendicular to line of the closure 1.5 cm Width of undermining done 1.5 cm  Mupirocin and a pressure dressing applied  mupirocin ointment (BACTROBAN) 2 %  Specimen 1 - Surgical pathology Differential Diagnosis: r/o Residual SCC Check Margins: Yes Keratotic pink papule/nodule or plaque.  Start mupirocin ointment qd with dressing changes.  Return in about 9 days (around 08/09/2019) for suture removal.   Graciella Belton, RMA, am acting as scribe for Forest Gleason, MD .  Documentation: I have reviewed the above documentation for accuracy and completeness, and I agree with the above.  Forest Gleason, MD

## 2019-07-31 NOTE — Patient Instructions (Signed)
Wound Care Instructions for After Surgery  On the day following your surgery, you should begin doing daily dressing changes until your sutures are removed: Remove the bandage. Cleanse the wound gently with soap and water.  Make sure you then dry the skin surrounding the wound completely or the tape will not stick to the skin. Do not use cotton balls on the wound. After the wound is clean and dry, apply the ointment (either prescription antibiotic prescribed by your doctor or plain Vaseline if nothing was prescribed) gently with a Q-tip. If you are using a bandaid to cover: Apply a bandaid large enough to cover the entire wound. If you do not have a bandaid large enough to cover the wound OR if you are sensitive to bandaid adhesive: Cut a non-stick pad (such as Telfa) to fit the size of the wound.  Cover the wound with the non-stick pad. If the wound is draining, you may want to add a small amount of gauze on top of the non-stick pad for a little added compression to the area. Use tape to seal the area completely.  For the next 1-2 weeks: Be sure to keep the wound moist with ointment 24/7 to ensure best healing. If you are unable to cover the wound with a bandage to hold the ointment in place, you may need to reapply the ointment several times a day. Do not bend over or lift heavy items to reduce the chance of elevated blood pressure to the wound. Do not participate in particularly strenuous activities.  Below is a list of dressing supplies you might need.  Cotton-tipped applicators - Q-tips Gauze pads (2x2 and/or 4x4) - [employer]-Purpose Sponges New and clean tube of petroleum jelly (Vaseline) OR prescription antibiotic ointment if prescribed Either a bandaid large enough to cover the entire wound OR non-stick dressing material (Telfa) and Tape (Paper or Hypafix)  FOR ADULT SURGERY PATIENTS: If you need something for pain relief, you may take 1 extra strength Tylenol (acetaminophen) and 2  ibuprofen (200 mg) together every 4 hours as needed. (Do not take these medications if you are allergic to them or if you know you cannot take them for any other reason). Typically you may only need pain medication for 1-3 days.   Comments on the Post-Operative Period Slight swelling and redness often appear around the wound. This is normal and will disappear within several days following the surgery. The healing wound will drain a brownish-red-yellow discharge during healing. This is a normal phase of wound healing. As the wound begins to heal, the drainage may increase in amount. Again, this drainage is normal. Notify us if the drainage becomes persistently bloody, excessively swollen, or intensely painful or develops a foul odor or red streaks.  The healing wound will also typically be itchy. This is normal. If you have severe or persistent pain, Notify us if the discomfort is severe or persistent. Avoid alcoholic beverages when taking pain medicine.  In Case of Wound Hemorrhage A wound hemorrhage is when the bandage suddenly becomes soaked with bright red blood and flows profusely. If this happens, sit down or lie down with your head elevated. If the wound has a dressing on it, do not remove the dressing. Apply pressure to the existing gauze. If the wound is not covered, use a gauze pad to apply pressure and continue applying the pressure for 20 minutes without peeking. DO NOT COVER THE WOUND WITH A LARGE TOWEL OR WASH CLOTH. Release your hand from the   wound site but do not remove the dressing. If the bleeding has stopped, gently clean around the wound. Leave the dressing in place for 24 hours if possible. This wait time allows the blood vessels to close off so that you do not spark a new round of bleeding by disrupting the newly clotted blood vessels with an immediate dressing change. If the bleeding does not subside, continue to hold pressure for 40 minutes. If bleeding continues, page your  physician, contact an After Hours clinic or go to the Emergency Room.   

## 2019-08-05 NOTE — Progress Notes (Signed)
Skin (M), right chest NO RESIDUAL SQUAMOUS CELL CARCINOMA, MARGINS FREE  Entire lesion appears to be out. No additional treatment needed at this time. We will remove sutures at the scheduled time. Please call our office [phone] with any questions.

## 2019-08-06 ENCOUNTER — Encounter: Payer: Self-pay | Admitting: Dermatology

## 2019-08-09 ENCOUNTER — Ambulatory Visit: Payer: Managed Care, Other (non HMO) | Admitting: Dermatology

## 2019-08-09 ENCOUNTER — Other Ambulatory Visit: Payer: Self-pay

## 2019-08-09 DIAGNOSIS — L570 Actinic keratosis: Secondary | ICD-10-CM | POA: Diagnosis not present

## 2019-08-09 DIAGNOSIS — Z86007 Personal history of in-situ neoplasm of skin: Secondary | ICD-10-CM | POA: Diagnosis not present

## 2019-08-09 DIAGNOSIS — Z85828 Personal history of other malignant neoplasm of skin: Secondary | ICD-10-CM | POA: Diagnosis not present

## 2019-08-09 DIAGNOSIS — L578 Other skin changes due to chronic exposure to nonionizing radiation: Secondary | ICD-10-CM | POA: Diagnosis not present

## 2019-08-09 NOTE — Progress Notes (Signed)
   Follow-Up Visit   Subjective  Rose Mendez is a 64 y.o. female who presents for the following: Actinic Keratosis (treatment of precancers on her arms,hands) and Suture / Staple Removal (1 wk f/u SCC margins free R chest ).  The following portions of the chart were reviewed this encounter and updated as appropriate: Tobacco  Allergies  Meds  Problems  Med Hx  Surg Hx  Fam Hx      Review of Systems: No other skin or systemic complaints.  Objective  Well appearing patient in no apparent distress; mood and affect are within normal limits.  A focused examination was performed including arms,face,chest,back. Relevant physical exam findings are noted in the Assessment and Plan.  Objective  Left Forearm x 6, L hand x 4, R forearm x 12, R dorsal hand x 3, chest x 4 (29): Erythematous thin papules/macules with gritty scale.   Objective  R chest: Well healed scar with no evidence of recurrence.   Objective  Left post shoulder: Well healed scar with no evidence of recurrence.   Objective  Right Forearm, Right Hand - dorsum: Well healed scar with no evidence of recurrence.   Assessment & Plan    Actinic Damage - diffuse scaly erythematous macules with underlying dyspigmentation - Recommend daily broad spectrum sunscreen SPF 30+ to sun-exposed areas, reapply every 2 hours as needed.  - Also recommend Heliocare daily and avoiding mid-day sun - Call for new or changing lesions.  AK (actinic keratosis) (29) Left Forearm x 6, L hand x 4, R forearm x 12, R dorsal hand x 3, chest x 4  Improved from previous. Discussed with patient we may consider referral to [facility] in the fall for laser assisted photodynamic therapy (if they still have this available) given her numerous hypertrophic actinic keratoses   She defers treatment of the hypertrophic actinic keratosis at her right forehead today but will monitor the area closely for changes before her next visit.    Destruction of  lesion - Left Forearm x 6, L hand x 4, R forearm x 12, R dorsal hand x 3, chest x 4 Complexity: simple   Destruction method: cryotherapy   Informed consent: discussed and consent obtained   Timeout:  patient name, date of birth, surgical site, and procedure verified Lesion destroyed using liquid nitrogen: Yes   Region frozen until ice ball extended beyond lesion: Yes   Outcome: patient tolerated procedure well with no complications   Post-procedure details: wound care instructions given    History of SCC (squamous cell carcinoma) of skin R chest  Wound cleansed, sutures removed, mupirocin ointment and band aid applied  Discussed pathology results   History of basal cell carcinoma (BCC) Left post shoulder  Mupirocin ointment and band aid applied   Observe   History of squamous cell carcinoma in situ (SCCIS) of skin (2) Right Hand - dorsum; Right Forearm  Mupirocin ointment and band aid applied  Observe   Return in about 4 months (around 12/09/2019), or for AKs, then plan FBSE approx 1 month after that.   I, Marye Round, CMA, am acting as scribe for Forest Gleason, MD  Documentation: I have reviewed the above documentation for accuracy and completeness, and I agree with the above.  Forest Gleason, MD

## 2019-08-09 NOTE — Patient Instructions (Addendum)
Cryotherapy Aftercare  . Wash gently with soap and water everyday.   Marland Kitchen Apply Vaseline and Band-Aid daily until healed.  Recommend taking Heliocare sun protection supplement daily in sunny weather for additional sun protection. For maximum protection on the sunniest days, you can take up to 2 capsules of regular Heliocare OR take 1 capsule of Heliocare Ultra. For prolonged exposure (such as a full day in the sun), you can repeat your dose of the supplement 4 hours after your first dose. Heliocare can be purchased at [facility] or at [email or web address].   Recommend daily broad spectrum sunscreen SPF 30+ to sun-exposed areas, reapply every 2 hours as needed. Call for new or changing lesions.

## 2019-08-15 ENCOUNTER — Encounter: Payer: Self-pay | Admitting: Dermatology

## 2019-08-21 ENCOUNTER — Ambulatory Visit: Payer: Managed Care, Other (non HMO) | Attending: Internal Medicine

## 2019-08-21 DIAGNOSIS — Z23 Encounter for immunization: Secondary | ICD-10-CM

## 2019-08-21 NOTE — Progress Notes (Signed)
   Covid-19 Vaccination Clinic  Name:  Rose Mendez    MRN: [id number] DOB: [birth year]  08/21/2019  Rose Mendez was observed post Covid-19 immunization for 15 minutes without incident. She was provided with Vaccine Information Sheet and instruction to access the V-Safe system.   Rose Mendez was instructed to call 911 with any severe reactions post vaccine: Marland Kitchen Difficulty breathing  . Swelling of face and throat  . A fast heartbeat  . A bad rash all over body  . Dizziness and weakness   Immunizations Administered    Name Date Dose VIS Date Route   Moderna COVID-19 Vaccine 08/21/2019  8:32 AM 0.5 mL 04/2019 Intramuscular   Manufacturer: Moderna   Lot: [id number]   [id number]

## 2019-12-11 ENCOUNTER — Ambulatory Visit: Payer: Managed Care, Other (non HMO) | Admitting: Dermatology

## 2019-12-12 ENCOUNTER — Telehealth: Payer: Self-pay

## 2019-12-12 NOTE — Telephone Encounter (Signed)
Called patient, she states that she canceled yesterdays appt, she did schedule TBSE for 11/3 at 10AM

## 2020-03-04 ENCOUNTER — Ambulatory Visit: Payer: Managed Care, Other (non HMO) | Admitting: Dermatology

## 2020-05-05 ENCOUNTER — Other Ambulatory Visit: Payer: Self-pay | Admitting: Obstetrics & Gynecology

## 2020-05-12 ENCOUNTER — Ambulatory Visit (INDEPENDENT_AMBULATORY_CARE_PROVIDER_SITE_OTHER): Payer: 59 | Admitting: Obstetrics & Gynecology

## 2020-05-12 ENCOUNTER — Encounter: Payer: Self-pay | Admitting: Obstetrics & Gynecology

## 2020-05-12 ENCOUNTER — Other Ambulatory Visit (HOSPITAL_COMMUNITY)
Admission: RE | Admit: 2020-05-12 | Discharge: 2020-05-12 | Disposition: A | Discharge status: Home / Self Care | Payer: 59 | Source: Ambulatory Visit | Attending: Obstetrics & Gynecology | Admitting: Obstetrics & Gynecology

## 2020-05-12 ENCOUNTER — Other Ambulatory Visit: Payer: Self-pay

## 2020-05-12 VITALS — BP 130/80 | Ht 61.5 in | Wt 314.0 lb

## 2020-05-12 DIAGNOSIS — Z1231 Encounter for screening mammogram for malignant neoplasm of breast: Secondary | ICD-10-CM

## 2020-05-12 DIAGNOSIS — Z01419 Encounter for gynecological examination (general) (routine) without abnormal findings: Secondary | ICD-10-CM | POA: Diagnosis not present

## 2020-05-12 DIAGNOSIS — Z124 Encounter for screening for malignant neoplasm of cervix: Secondary | ICD-10-CM | POA: Diagnosis not present

## 2020-05-12 NOTE — Patient Instructions (Signed)
PAP every three years Mammogram every year (soon)    Call [phone] to schedule at [facility] Colonoscopy every 10 years Labs yearly (with PCP)  Thank you for choosing [facility]. As part of our ongoing efforts to improve patient experience, we would appreciate your feedback. Please fill out the short survey that you will receive by mail or MyChart. Your opinion is important to [place]! - Dr. Kenton Kingfisher  Recommendations to boost your immunity to prevent illness such as viral flu and colds, including covid19, are as follows:    Vitamin K2 and Vitamin D3. Take Vitamin K2 at 200-300 mcg daily (usually 2-3 pills daily of the over the counter formulation). Take Vitamin D3 at 3000-4000 U daily (usually 3-4 pills daily of the over the counter formulation). Studies show that these two at high normal levels in your system are very effective in keeping your immunity so strong and protective that you will be unlikely to contract viral illness such as those listed above.  Dr Kenton Kingfisher

## 2020-05-12 NOTE — Progress Notes (Signed)
HPI:      Rose Mendez is a 65 y.o. [phone] who LMP was in the past, she presents today for her annual examination.  The patient has no complaints today. The patient is not currently sexually active. Herlast pap: approximate date 2018 and was normal and last mammogram: approximate date 2021 and was normal.  The patient does perform self breast exams.  There is notable family history of breast or ovarian cancer in her family. Pt is BRCA NEG.  The patient is not taking hormone replacement therapy. Patient denies post-menopausal vaginal bleeding.   The patient has regular exercise: no. The patient denies current symptoms of depression.    GYN Hx: Last Colonoscopy: in the past, uncertain date. Normal.  Last DEXA: never ago.   Urinary freq during day, denies urge or nocturia or LOU  PMHx: Past Medical History:  Diagnosis Date  . Actinic keratosis   . Anemia   . Basal cell carcinoma 08/2017   Right distal lateral thigh  . Basal cell carcinoma 06/26/2019   Left nasal sidewall  . Basal cell carcinoma 06/26/2019   Left posterior shoulder  . Diabetes type 2, controlled ([place])   . Family history of breast cancer    12/19 pt states she and aunts are BRCA neg  . Obesity   . Skin cancer   . 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 Squamous cell carcinoma of skin 06/26/2019   Right chest, re excised 07-31-2019  . Squamous cell carcinoma of skin    Right arm, Left shin  . Stress incontinence    Past Surgical History:  Procedure Laterality Date  . COLONOSCOPY    . CRYOTHERAPY    . ESOPHAGOGASTRODUODENOSCOPY    . SKIN BIOPSY    . TUBAL LIGATION     Family History  Problem Relation Age of Onset  . Uterine cancer Mother   . Breast cancer Maternal Aunt   . Lung cancer Maternal Aunt   . Breast cancer Maternal Aunt 62   Social History   Tobacco Use  . Smoking status: Never Smoker  . Smokeless tobacco: Never Used  Vaping Use  . Vaping Use: Never  used  Substance Use Topics  . Alcohol use: No  . Drug use: No    Current Outpatient Medications:  .  atenolol (TENORMIN) 50 MG tablet, TAKE 1 TABLET BY MOUTH  DAILY, Disp: , Rfl:  .  cetirizine (ZYRTEC) 10 MG tablet, Take by mouth., Disp: , Rfl:  .  lisinopril-hydrochlorothiazide (PRINZIDE,ZESTORETIC) 10-12.5 MG tablet, TAKE 1 TABLET BY MOUTH  DAILY, Disp: , Rfl:  .  allopurinol (ZYLOPRIM) 100 MG tablet, TAKE 1 TABLET BY MOUTH TWO  TIMES DAILY (Patient not taking: Reported on 05/12/2020), Disp: , Rfl:  .  calcipotriene (DOVONOX) 0.005 % cream, Apply twice daily following a layer of 5-fluorouracil twice daily for 7 days (Patient not taking: No sig reported), Disp: 60 g, Rfl: 1 .  fluorouracil (EFUDEX) 5 % cream, Apply thin layer followed by calcipotriene twice daily for 7 days as directed. Review office handout prior to use. (Patient not taking: No sig reported), Disp: 40 g, Rfl: 1 .  ibuprofen (ADVIL,MOTRIN) 200 MG tablet, Take by mouth. (Patient not taking: Reported on 05/12/2020), Disp: , Rfl:  .  mupirocin ointment (BACTROBAN) 2 %, Apply 1 application topically daily. Daily with dressing changes and cover with bandage. (Patient not taking: Reported on 05/12/2020), Disp: 22 g, Rfl: 0 Allergies: Patient  has no known allergies.  Review of Systems  Constitutional: Negative for chills, fever and malaise/fatigue.  HENT: Negative for congestion, sinus pain and sore throat.   Eyes: Negative for blurred vision and pain.  Respiratory: Negative for cough and wheezing.   Cardiovascular: Negative for chest pain and leg swelling.  Gastrointestinal: Negative for abdominal pain, constipation, diarrhea, heartburn, nausea and vomiting.  Genitourinary: Negative for dysuria, frequency, hematuria and urgency.  Musculoskeletal: Negative for back pain, joint pain, myalgias and neck pain.  Skin: Negative for itching and rash.  Neurological: Negative for dizziness, tremors and weakness.  Endo/Heme/Allergies: Does  not bruise/bleed easily.  Psychiatric/Behavioral: Negative for depression. The patient is not nervous/anxious and does not have insomnia.     Objective: BP 130/80   Ht 5' 1.5" (1.562 m)   Wt (!) 314 lb (142.4 kg)   BMI 58.37 kg/m   Filed Weights   05/12/20 1001  Weight: (!) 314 lb (142.4 kg)   Body mass index is 58.37 kg/m. Physical Exam Constitutional:      General: She is not in acute distress.    Appearance: She is well-developed and well-nourished. She is obese.  Genitourinary:     Vagina, uterus and rectum normal.     There is no rash or lesion on the right labia.     There is no rash or lesion on the left labia.    No lesions in the vagina.     No vaginal bleeding.      Right Adnexa: not tender and no mass present.    Left Adnexa: not tender and no mass present.    No cervical motion tenderness, friability, lesion or polyp.     Uterus is mobile.     Uterus is not enlarged.     No uterine mass detected.    Uterus exam comments: Exam limited by obesity.     Uterus is midaxial.     Pelvic exam was performed with patient supine.  Breasts:     Right: No mass, skin change or tenderness.     Left: No mass, skin change or tenderness.    HENT:     Head: Normocephalic and atraumatic. No laceration.     Right Ear: Hearing normal.     Left Ear: Hearing normal.     Nose: No epistaxis or foreign body.     Mouth/Throat:     Mouth: Oropharynx is clear and moist and mucous membranes are normal.     Pharynx: Uvula midline.  Eyes:     Pupils: Pupils are equal, round, and reactive to light.  Neck:     Thyroid: No thyromegaly.  Cardiovascular:     Rate and Rhythm: Normal rate and regular rhythm.     Heart sounds: No murmur heard. No friction rub. No gallop.   Pulmonary:     Effort: Pulmonary effort is normal. No respiratory distress.     Breath sounds: Normal breath sounds. No wheezing.  Abdominal:     General: Bowel sounds are normal. There is no distension.      Palpations: Abdomen is soft.     Tenderness: There is no abdominal tenderness. There is no rebound.  Musculoskeletal:        General: Normal range of motion.     Cervical back: Normal range of motion and neck supple.  Neurological:     Mental Status: She is alert and oriented to person, place, and time.     Cranial Nerves: No cranial nerve  deficit.  Skin:    General: Skin is warm and dry.  Psychiatric:        Mood and Affect: Mood and affect normal.        Judgment: Judgment normal.  Vitals reviewed.     Assessment: Annual Exam 1. Women's annual routine gynecological examination   2. Encounter for screening mammogram for malignant neoplasm of breast   3. Screening for cervical cancer     Plan:            1.  Cervical Screening-  Pap smear done today  2. Breast screening- Exam annually and mammogram scheduled  3. Colonoscopy every 10 years (has been done, does not remember date), Hemoccult testing after age 3  4. Labs managed by PCP  5. Counseling for hormonal therapy: none              6. FRAX - FRAX score for assessing the 10 year probability for fracture calculated and discussed today.  Based on age and score today, DEXA is not currently scheduled.    F/U  Return in about 1 year (around 05/12/2021) for Annual.  Barnett Applebaum, MD, [facility], [facility] 05/12/2020  10:38 AM

## 2020-05-14 LAB — CYTOLOGY - PAP
Adequacy: ABSENT
Comment: NEGATIVE
Diagnosis: NEGATIVE
High risk HPV: NEGATIVE

## 2020-05-28 ENCOUNTER — Encounter: Payer: Self-pay | Admitting: Obstetrics and Gynecology

## 2020-06-10 ENCOUNTER — Ambulatory Visit
Admission: RE | Admit: 2020-06-10 | Discharge: 2020-06-10 | Disposition: A | Discharge status: Home / Self Care | Payer: 59 | Source: Ambulatory Visit | Attending: Obstetrics & Gynecology | Admitting: Obstetrics & Gynecology

## 2020-06-10 ENCOUNTER — Other Ambulatory Visit: Payer: Self-pay

## 2020-06-10 DIAGNOSIS — Z1231 Encounter for screening mammogram for malignant neoplasm of breast: Secondary | ICD-10-CM | POA: Insufficient documentation

## 2020-06-12 ENCOUNTER — Inpatient Hospital Stay
Admission: RE | Admit: 2020-06-12 | Discharge: 2020-06-12 | Disposition: A | Discharge status: Home / Self Care | Payer: Self-pay | Source: Ambulatory Visit | Attending: *Deleted | Admitting: *Deleted

## 2020-06-12 ENCOUNTER — Other Ambulatory Visit: Payer: Self-pay | Admitting: *Deleted

## 2020-06-12 DIAGNOSIS — Z1231 Encounter for screening mammogram for malignant neoplasm of breast: Secondary | ICD-10-CM

## 2020-06-18 ENCOUNTER — Other Ambulatory Visit: Payer: Self-pay

## 2020-06-18 ENCOUNTER — Ambulatory Visit: Payer: 59 | Admitting: Dermatology

## 2020-06-18 DIAGNOSIS — T148XXA Other injury of unspecified body region, initial encounter: Secondary | ICD-10-CM

## 2020-06-18 DIAGNOSIS — D229 Melanocytic nevi, unspecified: Secondary | ICD-10-CM

## 2020-06-18 DIAGNOSIS — L578 Other skin changes due to chronic exposure to nonionizing radiation: Secondary | ICD-10-CM

## 2020-06-18 DIAGNOSIS — D1800 Hemangioma unspecified site: Secondary | ICD-10-CM

## 2020-06-18 DIAGNOSIS — Z86007 Personal history of in-situ neoplasm of skin: Secondary | ICD-10-CM

## 2020-06-18 DIAGNOSIS — L821 Other seborrheic keratosis: Secondary | ICD-10-CM

## 2020-06-18 DIAGNOSIS — Z1283 Encounter for screening for malignant neoplasm of skin: Secondary | ICD-10-CM

## 2020-06-18 DIAGNOSIS — L570 Actinic keratosis: Secondary | ICD-10-CM

## 2020-06-18 DIAGNOSIS — D489 Neoplasm of uncertain behavior, unspecified: Secondary | ICD-10-CM

## 2020-06-18 DIAGNOSIS — L814 Other melanin hyperpigmentation: Secondary | ICD-10-CM

## 2020-06-18 DIAGNOSIS — D0461 Carcinoma in situ of skin of right upper limb, including shoulder: Secondary | ICD-10-CM

## 2020-06-18 DIAGNOSIS — Z85828 Personal history of other malignant neoplasm of skin: Secondary | ICD-10-CM

## 2020-06-18 MED ORDER — FLUOROURACIL 5 % EX CREA: TOPICAL_CREAM | CUTANEOUS | 1 refills | Status: DC

## 2020-06-18 MED ORDER — MUPIROCIN 2 % EX OINT
1.0000 | TOPICAL_OINTMENT | Freq: Two times a day (BID) | CUTANEOUS | 0 refills | Status: DC
Start: 2020-06-18 — End: 2021-06-01

## 2020-06-18 MED ORDER — CALCIPOTRIENE 0.005 % EX CREA: TOPICAL_CREAM | CUTANEOUS | 1 refills | Status: DC

## 2020-06-18 NOTE — Patient Instructions (Addendum)
Recommend taking Heliocare sun protection supplement daily in sunny weather for additional sun protection. For maximum protection on the sunniest days, you can take up to 2 capsules of regular Heliocare OR take 1 capsule of Heliocare Ultra. For prolonged exposure (such as a full day in the sun), you can repeat your dose of the supplement 4 hours after your first dose. Heliocare can be purchased at [facility] or at [email or web address].   Recommend Nicotinamide 500mg  twice per day to lower risk of non-melanoma skin cancer by approximately 25%.   Cryotherapy Aftercare  . Wash gently with soap and water everyday.   Marland Kitchen Apply Vaseline and Band-Aid daily until healed.  Biopsy Wound Care Instructions  1. Leave the original bandage on for 24 hours if possible.  If the bandage becomes soaked or soiled before that time, it is OK to remove it and examine the wound.  A small amount of post-operative bleeding is normal.  If excessive bleeding occurs, remove the bandage, place gauze over the site and apply continuous pressure (no peeking) over the area for 30 minutes. If this does not work, please call our clinic as soon as possible or page your doctor if it is after hours.   2. Once a day, cleanse the wound with soap and water. It is fine to shower. If a thick crust develops you may use a Q-tip dipped into dilute hydrogen peroxide (mix 1:1 with water) to dissolve it.  Hydrogen peroxide can slow the healing process, so use it only as needed.    3. After washing, apply petroleum jelly (Vaseline) or an antibiotic ointment if your doctor prescribed one for you, followed by a bandage.    4. For best healing, the wound should be covered with a layer of ointment at all times. If you are not able to keep the area covered with a bandage to hold the ointment in place, this may mean re-applying the ointment several times a day.  Continue this wound care until the wound has healed and is no longer open.   Itching  and mild discomfort is normal during the healing process. However, if you develop pain or severe itching, please call our office.   If you have any discomfort, you can take Tylenol (acetaminophen) or ibuprofen as directed on the bottle. (Please do not take these if you have an allergy to them or cannot take them for another reason).  Some redness, tenderness and white or yellow material in the wound is normal healing.  If the area becomes very sore and red, or develops a thick yellow-green material (pus), it may be infected; please notify us.    If you have stitches, return to clinic as directed to have the stitches removed. You will continue wound care for 2-3 days after the stitches are removed.   Wound healing continues for up to one year following surgery. It is not unusual to experience pain in the scar from time to time during the interval.  If the pain becomes severe or the scar thickens, you should notify the office.    A slight amount of redness in a scar is expected for the first six months.  After six months, the redness will fade and the scar will soften and fade.  The color difference becomes less noticeable with time.  If there are any problems, return for a post-op surgery check at your earliest convenience.  To improve the appearance of the scar, you can use silicone scar gel, cream,  or sheets (such as Mederma or Serica) every night for up to one year. These are available over the counter (without a prescription).  Please call our office at [phone] for any questions or concerns.   5-Fluorouracil/Calcipotriene Patient Education   Actinic keratoses are the dry, red scaly spots on the skin caused by sun damage. A portion of these spots can turn into skin cancer with time, and treating them can help prevent development of skin cancer.   Treatment of these spots requires removal of the defective skin cells. There are various ways to remove actinic keratoses, including freezing  with liquid nitrogen, treatment with creams, or treatment with a blue light procedure in the office.   5-fluorouracil cream is a topical cream used to treat actinic keratoses. It works by interfering with the growth of abnormal fast-growing skin cells, such as actinic keratoses. These cells peel off and are replaced by healthy ones.   5-fluorouracil/calcipotriene is a combination of the 5-fluorouracil cream with a vitamin D analog cream called calcipotriene. The calcipotriene alone does not treat actinic keratoses. However, when it is combined with 5-fluorouracil, it helps the 5-fluorouracil treat the actinic keratoses much faster so that the same results can be achieved with a much shorter treatment time.  INSTRUCTIONS FOR 5-FLUOROURACIL/CALCIPOTRIENE CREAM:   5-fluorouracil/calcipotriene cream typically only needs to be used for 4-7 days. A thin layer should be applied twice a day to the treatment areas recommended by your physician.   If your physician prescribed you separate tubes of 5-fluourouracil and calcipotriene, apply a thin layer of 5-fluorouracil followed by a thin layer of calcipotriene.   Avoid contact with your eyes, nostrils, and mouth. Do not use 5-fluorouracil/calcipotriene cream on infected or open wounds.   You will develop redness, irritation and some crusting at areas where you have pre-cancer damage/actinic keratoses. IF YOU DEVELOP PAIN, BLEEDING, OR SIGNIFICANT CRUSTING, STOP THE TREATMENT EARLY - you have already gotten a good response and the actinic keratoses should clear up well.  Wash your hands after applying 5-fluorouracil 5% cream on your skin.   A moisturizer or sunscreen with a minimum SPF 30 should be applied each morning.   Once you have finished the treatment, you can apply a thin layer of Vaseline twice a day to irritated areas to soothe and calm the areas more quickly. If you experience significant discomfort, contact your physician.  For some patients it  is necessary to repeat the treatment for best results.  SIDE EFFECTS: When using 5-fluorouracil/calcipotriene cream, you may have mild irritation, such as redness, dryness, swelling, or a mild burning sensation. This usually resolves within 2 weeks. The more actinic keratoses you have, the more redness and inflammation you can expect during treatment. Eye irritation has been reported rarely. If this occurs, please let us know.  If you have any trouble using this cream, please call the office. If you have any other questions about this information, please do not hesitate to ask me before you leave the office.

## 2020-06-18 NOTE — Progress Notes (Signed)
Follow-Up Visit   Subjective  Rose Mendez is a 65 y.o. female who presents for the following: tbse (Patient here today for TBSE. She has no new concerns today. ).  Patient has history scc , bcc, and actinic damage. Patient here for full body skin exam and skin cancer screening.   The following portions of the chart were reviewed this encounter and updated as appropriate:       Objective  Well appearing patient in no apparent distress; mood and affect are within normal limits.  A full examination was performed including scalp, head, eyes, ears, nose, lips, neck, chest, axillae, abdomen, back, buttocks, bilateral upper extremities, bilateral lower extremities, hands, feet, fingers, toes, fingernails, and toenails. All findings within normal limits unless otherwise noted below.  Objective  right forearm: 0.4 cm pink papule with central crust      Objective  right pretibia: Erythematous thin papules/macules with gritty scale.   Assessment & Plan   Actinic Damage - Severe, chronic, secondary to cumulative UV radiation exposure over time - diffuse scaly erythematous macules and papules with underlying dyspigmentation - Discussed Prescription "Field Treatment" for Severe, Chronic Confluent Actinic Changes with Pre-Cancerous Actinic Keratoses Field treatment involves treatment of an entire area of skin that has confluent Actinic Changes (Sun/ Ultraviolet light damage) and PreCancerous Actinic Keratoses by method of PhotoDynamic Therapy (PDT) and/or prescription Topical Chemotherapy agents such as 5-fluorouracil, 5-fluorouracil/calcipotriene, and/or imiquimod.  The purpose is to decrease the number of clinically evident and subclinical PreCancerous lesions to prevent progression to development of skin cancer by chemically destroying early precancer changes that may or may not be visible.  It has been shown to reduce the risk of developing skin cancer in the treated area. As a result  of treatment, redness, scaling, crusting, and open sores may occur during treatment course. One or more than one of these methods may be used and may have to be used several times to control, suppress and eliminate the PreCancerous changes. Discussed treatment course, expected reaction, and possible side effects. - Recommend daily broad spectrum sunscreen SPF 30+ to sun-exposed areas, reapply every 2 hours as needed.  - Call for new or changing lesions. - Start 5-fluorouracil/calcipotriene cream twice a day for 7 days to affected areas including bilateral forearms and hands will plan to follow with chest and upper arms . Prescription sent to [facility]. Patient advised they will receive an email to purchase the medication online and have it sent to their home. Patient provided with handout reviewing treatment course and side effects and advised to call or message [place] on MyChart with any concerns.    Neoplasm of uncertain behavior right forearm  Skin / nail biopsy Type of biopsy: tangential   Informed consent: discussed and consent obtained   Timeout: patient name, date of birth, surgical site, and procedure verified   Patient was prepped and draped in usual sterile fashion: Area prepped with isopropyl alcohol. Anesthesia: the lesion was anesthetized in a standard fashion   Anesthetic:  1% lidocaine w/ epinephrine 1-100,000 buffered w/ 8.4% NaHCO3 Instrument used: flexible razor blade   Hemostasis achieved with: aluminum chloride   Outcome: patient tolerated procedure well   Post-procedure details: wound care instructions given   Additional details:  Mupirocin and a bandage applied  Specimen 1 - Surgical pathology Differential Diagnosis: r/o scc  Check Margins: No 0.4 cm pink papule with central crust  R/o scc  Actinic keratosis right pretibia  Prior to procedure, discussed  risks of blister formation, small wound, skin dyspigmentation, or rare scar following  cryotherapy.    Destruction of lesion - right pretibia  Destruction method: cryotherapy   Informed consent: discussed and consent obtained   Lesion destroyed using liquid nitrogen: Yes   Cryotherapy cycles:  2 Outcome: patient tolerated procedure well with no complications   Post-procedure details: wound care instructions given    Open wound Right upper abdomen  Start Mupirocin ointment 2% apply 1 application topically twice daily on open area upper abdomen and right forearm.     Ordered Medications: mupirocin ointment (BACTROBAN) 2 %  Actinic skin damage  Reordered Medications calcipotriene (DOVONOX) 0.005 % cream fluorouracil (EFUDEX) 5 % cream   Lentigines - Scattered tan macules - Discussed due to sun exposure - Benign, observe - Call for any changes  Seborrheic Keratoses - Stuck-on, waxy, tan-brown papules and plaques  - Discussed benign etiology and prognosis. - Observe - Call for any changes  Melanocytic Nevi - Tan-brown and/or pink-flesh-colored symmetric macules and papules - Benign appearing on exam today - Observation - Call clinic for new or changing moles - Recommend daily use of broad spectrum spf 30+ sunscreen to sun-exposed areas.   Hemangiomas - Red papules - Discussed benign nature - Observe - Call for any changes  Actinic Damage - Severe, chronic, secondary to cumulative UV radiation exposure over time - diffuse scaly erythematous macules and papules with underlying dyspigmentation - Discussed Prescription "Field Treatment" for Severe, Chronic Confluent Actinic Changes with Pre-Cancerous Actinic Keratoses Field treatment involves treatment of an entire area of skin that has confluent Actinic Changes (Sun/ Ultraviolet light damage) and PreCancerous Actinic Keratoses by method of PhotoDynamic Therapy (PDT) and/or prescription Topical Chemotherapy agents such as 5-fluorouracil, 5-fluorouracil/calcipotriene, and/or imiquimod.  The purpose is to  decrease the number of clinically evident and subclinical PreCancerous lesions to prevent progression to development of skin cancer by chemically destroying early precancer changes that may or may not be visible.  It has been shown to reduce the risk of developing skin cancer in the treated area. As a result of treatment, redness, scaling, crusting, and open sores may occur during treatment course. One or more than one of these methods may be used and may have to be used several times to control, suppress and eliminate the PreCancerous changes. Discussed treatment course, expected reaction, and possible side effects. - Recommend daily broad spectrum sunscreen SPF 30+ to sun-exposed areas, reapply every 2 hours as needed.  - Call for new or changing lesions. - Start 5-fluorouracil + calcipotriene cream twice a day for 7  days to affected areas including arms once biopsy site healed and site treated if indicated  Skin cancer screening performed today.  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  - Call if any new or changing lesions are noted between office visits  History of Squamous Cell Carcinoma of the Skin Right chest , Right arm, Left shin - No evidence of recurrence today - No lymphadenopathy - Recommend regular full body skin exams - Recommend daily broad spectrum sunscreen SPF 30+ to sun-exposed areas, reapply every 2 hours as needed.  - Call if any new or changing lesions are noted between office visits - Recommend Nicotinamide 500mg  twice per day to lower risk of  non-melanoma skin cancer by approximately 25%.   History of Basal Cell Carcinoma of the Skin Left posterior shoulder, Right distal lateral thigh, and Left nasal sidewall - No evidence of recurrence  today - Recommend regular full body skin exams - Recommend daily broad spectrum sunscreen SPF 30+ to sun-exposed areas, reapply every 2 hours as needed.  - Call if any new or changing lesions are noted between office visits - Recommend Nicotinamide 500mg  twice per day to lower risk of non-melanoma skin cancer by approximately 25%.   Return in about 3 months (around 09/15/2020) for follow up on biopsy right forearm and actinic damage , 6 month tbse .  I, Ruthell Rummage, CMA, am acting as scribe for Forest Gleason, MD.  Documentation: I have reviewed the above documentation for accuracy and completeness, and I agree with the above.  Forest Gleason, MD

## 2020-06-23 ENCOUNTER — Telehealth: Payer: Self-pay

## 2020-06-23 NOTE — Telephone Encounter (Signed)
-----   Message from Alfonso Patten, MD sent at 06/23/2020 10:53 AM EST ----- Skin , right forearm SQUAMOUS CELL CARCINOMA IN SITU --> ED&C  in next 6-8 weeks  MAs please call. Let Dr. Jerilynn Mages know if she has any questions. Thank you!

## 2020-06-23 NOTE — Telephone Encounter (Signed)
Patient advised bx results showed SCCis and is scheduled for EDC 4/5 at 8:30am, JS

## 2020-08-04 ENCOUNTER — Encounter: Payer: Self-pay | Admitting: Dermatology

## 2020-08-04 ENCOUNTER — Ambulatory Visit (INDEPENDENT_AMBULATORY_CARE_PROVIDER_SITE_OTHER): Payer: 59 | Admitting: Dermatology

## 2020-08-04 ENCOUNTER — Other Ambulatory Visit: Payer: Self-pay

## 2020-08-04 DIAGNOSIS — D0461 Carcinoma in situ of skin of right upper limb, including shoulder: Secondary | ICD-10-CM | POA: Diagnosis not present

## 2020-08-04 DIAGNOSIS — L820 Inflamed seborrheic keratosis: Secondary | ICD-10-CM | POA: Diagnosis not present

## 2020-08-04 DIAGNOSIS — D099 Carcinoma in situ, unspecified: Secondary | ICD-10-CM

## 2020-08-04 NOTE — Patient Instructions (Signed)

## 2020-08-04 NOTE — Progress Notes (Signed)
   Follow-Up Visit   Subjective  Rose Mendez is a 65 y.o. female who presents for the following: Procedure (Patient here today to treat bx proven SCCis at right forearm. ).  She also has 2 spots at left neck that are irritated and bothersome.  The following portions of the chart were reviewed this encounter and updated as appropriate:   Tobacco  Allergies  Meds  Problems  Med Hx  Surg Hx  Fam Hx      Review of Systems:  No other skin or systemic complaints except as noted in HPI or Assessment and Plan.  Objective  Well appearing patient in no apparent distress; mood and affect are within normal limits.  A focused examination was performed including right forearm, left neck. Relevant physical exam findings are noted in the Assessment and Plan.  Objective  Right Forearm: Healing bx site  Objective  Left Neck x 2 (2): Erythematous keratotic or waxy stuck-on papule or plaque.    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 Anesthesia: the lesion was anesthetized in a standard fashion   Anesthetic:  1% lidocaine w/ epinephrine 1-100,000 buffered w/ 8.4% NaHCO3 Curettage performed in three different directions: Yes   Electrodesiccation performed over the curetted area: Yes   Curettage cycles:  3 Final wound size (cm):  1.4 Hemostasis achieved with:  electrodesiccation Outcome: patient tolerated procedure well with no complications   Post-procedure details: wound care instructions given   Additional details:  Mupirocin and a pressure dressing applied  Inflamed seborrheic keratosis (2) Left Neck x 2  Over Irritated Acrochordons  Prior to  procedure, discussed risks of blister formation, small wound, skin dyspigmentation, or rare scar following cryotherapy.    Destruction of lesion - Left Neck x 2  Destruction method: cryotherapy   Informed consent: discussed and consent obtained   Lesion destroyed using liquid nitrogen: Yes   Cryotherapy cycles:  2 Outcome: patient tolerated procedure well with no complications   Post-procedure details: wound care instructions given    Return for AK follow up as scheduled.  Graciella Belton, RMA, am acting as scribe for Forest Gleason, MD .  Documentation: I have reviewed the above documentation for accuracy and completeness, and I agree with the above.  Forest Gleason, MD

## 2020-09-17 ENCOUNTER — Ambulatory Visit: Payer: 59 | Admitting: Dermatology

## 2020-12-14 DIAGNOSIS — M109 Gout, unspecified: Secondary | ICD-10-CM | POA: Insufficient documentation

## 2020-12-14 DIAGNOSIS — Z6841 Body Mass Index (BMI) 40.0 and over, adult: Secondary | ICD-10-CM | POA: Insufficient documentation

## 2020-12-24 ENCOUNTER — Ambulatory Visit: Payer: 59 | Admitting: Dermatology

## 2021-04-01 ENCOUNTER — Ambulatory Visit: Payer: 59 | Admitting: Dermatology

## 2021-04-01 ENCOUNTER — Other Ambulatory Visit: Payer: Self-pay

## 2021-04-01 DIAGNOSIS — M25542 Pain in joints of left hand: Secondary | ICD-10-CM | POA: Diagnosis not present

## 2021-04-01 DIAGNOSIS — Z85828 Personal history of other malignant neoplasm of skin: Secondary | ICD-10-CM

## 2021-04-01 DIAGNOSIS — L578 Other skin changes due to chronic exposure to nonionizing radiation: Secondary | ICD-10-CM

## 2021-04-01 DIAGNOSIS — D229 Melanocytic nevi, unspecified: Secondary | ICD-10-CM

## 2021-04-01 DIAGNOSIS — M6748 Ganglion, other site: Secondary | ICD-10-CM | POA: Diagnosis not present

## 2021-04-01 DIAGNOSIS — M2550 Pain in unspecified joint: Secondary | ICD-10-CM

## 2021-04-01 DIAGNOSIS — Z1283 Encounter for screening for malignant neoplasm of skin: Secondary | ICD-10-CM | POA: Diagnosis not present

## 2021-04-01 DIAGNOSIS — L814 Other melanin hyperpigmentation: Secondary | ICD-10-CM

## 2021-04-01 DIAGNOSIS — Z86007 Personal history of in-situ neoplasm of skin: Secondary | ICD-10-CM

## 2021-04-01 DIAGNOSIS — M67479 Ganglion, unspecified ankle and foot: Secondary | ICD-10-CM

## 2021-04-01 DIAGNOSIS — D1801 Hemangioma of skin and subcutaneous tissue: Secondary | ICD-10-CM

## 2021-04-01 DIAGNOSIS — L821 Other seborrheic keratosis: Secondary | ICD-10-CM

## 2021-04-01 DIAGNOSIS — M25541 Pain in joints of right hand: Secondary | ICD-10-CM

## 2021-04-01 DIAGNOSIS — Z872 Personal history of diseases of the skin and subcutaneous tissue: Secondary | ICD-10-CM

## 2021-04-01 NOTE — Progress Notes (Signed)
Follow-Up Visit   Subjective  Rose Mendez is a 65 y.o. female who presents for the following: Annual Exam (Hx BCC, SCC, and AK's - patient has noticed no new or changing moles, lesions, or spots.). The patient presents for Total-Body Skin Exam (TBSE) for skin cancer screening and mole check.  The following portions of the chart were reviewed this encounter and updated as appropriate:   Tobacco  Allergies  Meds  Problems  Med Hx  Surg Hx  Fam Hx      Review of Systems:  No other skin or systemic complaints except as noted in HPI or Assessment and Plan.  Objective  Well appearing patient in no apparent distress; mood and affect are within normal limits.  A full examination was performed including scalp, head, eyes, ears, nose, lips, neck, chest, axillae, abdomen, back, buttocks, bilateral upper extremities, bilateral lower extremities, hands, feet, fingers, toes, fingernails, and toenails. All findings within normal limits unless otherwise noted below.  R forearm Clear.   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  - Call if any new or changing lesions are noted between office visits - No lymphadenopathy    Digital mucous cyst of toe L 2nd toe  Benign appearing, if bothersome we could treat with LN2 or if really bothersome refer her for surgery.   Arthralgia, unspecified joint B/L hands  Pain and stiffness in am lasting over 30 minutes. Recommend referral to rheumatologist patient prefers to have her PCP make the referral.  Lentigines - Scattered tan macules - Due to sun exposure - Benign-appearing, observe - Recommend daily broad spectrum  sunscreen SPF 30+ to sun-exposed areas, reapply every 2 hours as needed. - Call for any changes  Seborrheic Keratoses - Stuck-on, waxy, tan-brown papules and/or plaques  - Benign-appearing - Discussed benign etiology and prognosis. - Observe - Call for any changes  Melanocytic Nevi - Tan-brown and/or pink-flesh-colored symmetric macules and papules - Benign appearing on exam today - Observation - Call clinic for new or changing moles - Recommend daily use of broad spectrum spf 30+ sunscreen to sun-exposed areas.   Hemangiomas - Red papules - Discussed benign nature - Observe - Call for any changes  Actinic Damage - Severe, confluent actinic changes with pre-cancerous actinic keratoses  - Severe, chronic, not at goal, secondary to cumulative UV radiation exposure over time - diffuse scaly erythematous macules and papules with underlying dyspigmentation - Discussed Prescription "Field Treatment" for Severe, Chronic Confluent Actinic Changes with Pre-Cancerous Actinic Keratoses Field treatment involves treatment of an entire area of skin that has confluent Actinic Changes (Sun/ Ultraviolet light damage) and PreCancerous Actinic Keratoses by method of PhotoDynamic Therapy (PDT) and/or prescription Topical Chemotherapy agents such as 5-fluorouracil, 5-fluorouracil/calcipotriene, and/or imiquimod.  The purpose is to decrease the number of clinically evident and subclinical PreCancerous lesions to prevent progression to development of skin cancer by chemically destroying early precancer changes that may or may not be visible.  It has been shown to reduce the risk of developing skin cancer in the treated area. As a result of treatment, redness, scaling, crusting, and open sores may occur during treatment course. One or more than  one of these methods may be used and may have to be used several times to control, suppress and eliminate the PreCancerous changes. Discussed treatment course, expected  reaction, and possible side effects. - Recommend daily broad spectrum sunscreen SPF 30+ to sun-exposed areas, reapply every 2 hours as needed.  - Staying in the shade or wearing long sleeves, sun glasses (UVA+UVB protection) and wide brim hats (4-inch brim around the entire circumference of the hat) are also recommended. - Call for new or changing lesions. - After the holiday season start prescription 5FU/Calcipotriene mix BID x 7 days on the arms and hands (patient has at home). Recommend repeating treatment for a second time on the R arm.   History of Basal Cell Carcinoma of the Skin - No evidence of recurrence today - Recommend regular full body skin exams - Recommend daily broad spectrum sunscreen SPF 30+ to sun-exposed areas, reapply every 2 hours as needed.  - Call if any new or changing lesions are noted between office visits  Skin cancer screening performed today.  Return in about 7 months (around 10/30/2021) for TBSE .  Luther Redo, CMA, am acting as scribe for Forest Gleason, MD .  Documentation: I have reviewed the above documentation for accuracy and completeness, and I agree with the above.  Forest Gleason, MD

## 2021-04-01 NOTE — Patient Instructions (Addendum)
5-fluorouracil/calcipotriene cream is is a type of field treatment used to treat precancers, thin skin cancers, and areas of sun damage. Reviewed expected reaction including irritation and mild inflammation potentially progressing to more severe inflammation including redness, scaling, crusting and open sores/erosions.  Reviewed if too much irritation occurs, ensure application of only a thin layer and decrease frequency of use to achieve a tolerable level of inflammation. Recommend applying Vaseline ointment to open sores as needed.  Minimize sun exposure while under treatment. Recommend daily broad spectrum sunscreen SPF 30+ to sun-exposed areas, reapply every 2 hours as needed.     If You Need Anything After Your Visit  If you have any questions or concerns for your doctor, please call our main line at [phone] and press option 4 to reach your doctor's medical assistant. If no one answers, please leave a voicemail as directed and we will return your call as soon as possible. Messages left after 4 pm will be answered the following business day.   You may also send [place] a message via [place]. We typically respond to MyChart messages within 1-2 business days.  For prescription refills, please ask your pharmacy to contact our office. Our fax number is [phone].  If you have an urgent issue when the clinic is closed that cannot wait until the next business day, you can page your doctor at the number below.    Please note that while we do our best to be available for urgent issues outside of office hours, we are not available 24/7.   If you have an urgent issue and are unable to reach [place], you may choose to seek medical care at your doctor's office, retail clinic, urgent care center, or emergency room.  If you have a medical emergency, please immediately call 911 or go to the emergency department.  Pager Numbers  - Dr. Nehemiah Massed: [phone]  - Dr. Laurence Ferrari: [phone]  - Dr. Nicole Kindred:  [phone]  In the event of inclement weather, please call our main line at [phone] for an update on the status of any delays or closures.  Dermatology Medication Tips: Please keep the boxes that topical medications come in in order to help keep track of the instructions about where and how to use these. Pharmacies typically print the medication instructions only on the boxes and not directly on the medication tubes.   If your medication is too expensive, please contact our office at [phone] option 4 or send [place] a message through [place].   We are unable to tell what your co-pay for medications will be in advance as this is different depending on your insurance coverage. However, we may be able to find a substitute medication at lower cost or fill out paperwork to get insurance to cover a needed medication.   If a prior authorization is required to get your medication covered by your insurance company, please allow [place] 1-2 business days to complete this process.  Drug prices often vary depending on where the prescription is filled and some pharmacies may offer cheaper prices.  The website www.goodrx.com contains coupons for medications through different pharmacies. The prices here do not account for what the cost may be with help from insurance (it may be cheaper with your insurance), but the website can give you the price if you did not use any insurance.  - You can print the associated coupon and take it with your prescription to the pharmacy.  - You may also stop by our office during regular  business hours and pick up a GoodRx coupon card.  - If you need your prescription sent electronically to a different pharmacy, notify our office through [facility] or by phone at [phone] option 4.     Si Usted Necesita Algo Despus de Su Visita  Tambin puede enviarnos un mensaje a travs de Pharmacist, community. Por lo general respondemos a los mensajes de MyChart en el transcurso de 1 a 2  das hbiles.  Para renovar recetas, por favor pida a su farmacia que se ponga en contacto con nuestra oficina. Harland Dingwall de fax es [place] [phone].  Si tiene un asunto urgente cuando la clnica est cerrada y que no puede esperar hasta el siguiente da hbil, puede llamar/localizar a su doctor(a) al nmero que aparece a continuacin.   Por favor, tenga en cuenta que aunque hacemos todo lo posible para estar disponibles para asuntos urgentes fuera del horario de [place], no estamos disponibles las 24 horas del da, los 7 das de la [place].   Si tiene un problema urgente y no puede comunicarse con nosotros, puede optar por buscar atencin mdica  en el consultorio de su doctor(a), en una clnica privada, en un centro de atencin urgente o en una sala de emergencias.  Si tiene Engineering geologist, por favor llame inmediatamente al 911 o vaya a la sala de emergencias.  Nmeros de bper  - Dr. Nehemiah Massed: [phone]  - Dra. Moye: [phone]  - Dra. Nicole Kindred: [phone]  En caso de inclemencias del [place], por favor llame a Johnsie Kindred principal al [phone] para una actualizacin sobre el [place] de cualquier retraso o cierre.  Consejos para la medicacin en dermatologa: Por favor, guarde las cajas en las que vienen los medicamentos de uso tpico para ayudarle a seguir las instrucciones sobre dnde y cmo usarlos. Las farmacias generalmente imprimen las instrucciones del medicamento slo en las cajas y no directamente en los tubos del [place].   Si su medicamento es muy caro, por favor, pngase en contacto con Zigmund Daniel llamando al [phone] y presione la opcin 4 o envenos un mensaje a travs de Pharmacist, community.   No podemos decirle cul ser su copago por los medicamentos por adelantado ya que esto es diferente dependiendo de la cobertura de su seguro. Sin embargo, es posible que podamos encontrar un medicamento sustituto a Electrical engineer un formulario para que el  seguro cubra el medicamento que se considera necesario.   Si se requiere una autorizacin previa para que su compaa de seguros [place] su medicamento, por favor permtanos de 1 a 2 das hbiles para completar este proceso.  Los precios de los medicamentos varan con frecuencia dependiendo del Environmental consultant de dnde se surte la receta y alguna farmacias pueden ofrecer precios ms baratos.  El sitio web www.goodrx.com tiene cupones para medicamentos de Airline pilot. Los precios aqu no tienen en cuenta lo que podra costar con la ayuda del seguro (puede ser ms barato con su seguro), pero el sitio web puede darle el precio si no utiliz Research scientist (physical sciences).  - Puede imprimir el cupn correspondiente y llevarlo con su receta a la farmacia.  - Tambin puede pasar por nuestra oficina durante el horario de atencin regular y Charity fundraiser una tarjeta de cupones de GoodRx.  - Si necesita que su receta se enve electrnicamente a una farmacia diferente, informe a nuestra oficina a travs de MyChart de [place] o por telfono llamando al [phone] y presione la opcin 4.

## 2021-04-06 ENCOUNTER — Encounter: Payer: Self-pay | Admitting: Dermatology

## 2021-04-15 ENCOUNTER — Telehealth: Payer: Self-pay

## 2021-04-15 NOTE — Telephone Encounter (Signed)
Patient advised of information per Dr. Moye. 

## 2021-04-15 NOTE — Telephone Encounter (Signed)
Patient called asked for clarification on which medication she could d/c during the winter, HelioCare or the Niacinamide? I did not see anything in her note and did not want to tell her wrong. Please advise and I will call her back.

## 2021-04-15 NOTE — Telephone Encounter (Signed)
She should take nicotinamide/niacinamide year-round and heliocare can be used just for sun exposure. Thank you!

## 2021-05-10 ENCOUNTER — Other Ambulatory Visit: Payer: Self-pay | Admitting: Family Medicine

## 2021-05-10 DIAGNOSIS — Z1231 Encounter for screening mammogram for malignant neoplasm of breast: Secondary | ICD-10-CM

## 2021-06-01 ENCOUNTER — Ambulatory Visit (INDEPENDENT_AMBULATORY_CARE_PROVIDER_SITE_OTHER): Payer: 59 | Admitting: Obstetrics and Gynecology

## 2021-06-01 ENCOUNTER — Other Ambulatory Visit: Payer: Self-pay

## 2021-06-01 ENCOUNTER — Encounter: Payer: Self-pay | Admitting: Obstetrics and Gynecology

## 2021-06-01 VITALS — BP 140/80 | Ht 61.5 in | Wt 325.0 lb

## 2021-06-01 DIAGNOSIS — Z8049 Family history of malignant neoplasm of other genital organs: Secondary | ICD-10-CM | POA: Diagnosis not present

## 2021-06-01 DIAGNOSIS — Z01419 Encounter for gynecological examination (general) (routine) without abnormal findings: Secondary | ICD-10-CM | POA: Diagnosis not present

## 2021-06-01 DIAGNOSIS — Z803 Family history of malignant neoplasm of breast: Secondary | ICD-10-CM

## 2021-06-01 DIAGNOSIS — Z1211 Encounter for screening for malignant neoplasm of colon: Secondary | ICD-10-CM | POA: Diagnosis not present

## 2021-06-01 DIAGNOSIS — Z1231 Encounter for screening mammogram for malignant neoplasm of breast: Secondary | ICD-10-CM

## 2021-06-01 DIAGNOSIS — Z801 Family history of malignant neoplasm of trachea, bronchus and lung: Secondary | ICD-10-CM | POA: Diagnosis not present

## 2021-06-01 NOTE — Patient Instructions (Signed)
I value your feedback and you entrusting us with your care. If you get a [facility] patient survey, I would appreciate you taking the time to let us know about your experience today. Thank you! ? ? ?

## 2021-06-01 NOTE — Progress Notes (Signed)
PCP: Juluis Pitch, MD   Chief Complaint  Patient presents with   Gynecologic Exam    No concerns    HPI:      [name] is a 66 y.o. [id number] whose LMP was No LMP recorded. Patient is postmenopausal., presents today for her annual examination.  Her menses are absent due to menopause. No PMB. She does not have vasomotor sx.   Sex activity: not sexually active. She does not have vaginal dryness/sx.  Last Pap: 05/12/20 Results were: no abnormalities /neg HPV DNA.   Last mammogram: 06/10/20 Results were: normal--routine follow-up in 12 months; has appt 2/23 There is a FH of breast cancer in 2 mat aunts, pt and aunts are BRCA neg (pt states her testing was done here but I can't find results in [facility]/chart). There is a FH of uterine cancer in her mother. There is no FH of ovarian cancer. The patient does not do self-breast exams.  Colonoscopy: 3/16 at [facility];  Repeat due after 10 years per pt.   Tobacco use: The patient denies current or previous tobacco use. Alcohol use: rare No drug use Exercise: moderately active  Has occas SUI, not improved with kegels.   She does get adequate calcium but not Vitamin D in her diet.  Labs with PCP.   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   Hypertension 12/13/2013    Past Surgical History:  Procedure Laterality Date   COLONOSCOPY     CRYOTHERAPY     ESOPHAGOGASTRODUODENOSCOPY     SKIN BIOPSY     TUBAL LIGATION      Family History  Problem Relation Age of Onset   Uterine cancer Mother 77   Breast cancer Maternal Aunt 38   Lung cancer Maternal Aunt    Breast cancer Maternal Aunt 62   Breast cancer Cousin        not sure of age    Social History   Socioeconomic History   Marital status: Married    Spouse  name: Not on file   Number of children: Not on file   Years of education: Not on file   Highest education level: Not on file  Occupational History   Not on file  Tobacco Use   Smoking status: Never   Smokeless tobacco: Never  Vaping Use   Vaping Use: Never used  Substance and Sexual Activity   Alcohol use: No   Drug use: No   Sexual activity: Not Currently    Birth control/protection: None  Other Topics Concern   Not on file  Social History Narrative   Not on file   Social Determinants of Health   Financial Resource Strain: Not on file  Food Insecurity: Not on file  Transportation Needs: Not on file  Physical Activity: Not on file  Stress: Not on file  Social Connections: Not on file  Intimate Partner Violence: Not on file     Current Outpatient Medications:    atenolol (TENORMIN) 50 MG tablet, TAKE 1 TABLET BY MOUTH  DAILY, Disp: , Rfl:    calcipotriene (DOVONOX) 0.005 % cream, Apply twice daily following a layer of 5-fluorouracil twice daily for 7 days, Disp: 60 g, Rfl: 1   cetirizine (ZYRTEC) 10 MG tablet, Take by mouth., Disp: , Rfl:  Cyanocobalamin (VITAMIN B-12 PO), Take by mouth., Disp: , Rfl:    fluorouracil (EFUDEX) 5 % cream, Apply thin layer followed by calcipotriene twice daily for 7 days as directed. Review office handout prior to use., Disp: 40 g, Rfl: 1   ibuprofen (ADVIL,MOTRIN) 200 MG tablet, Take 200 mg by mouth daily as needed., Disp: , Rfl:    lisinopril-hydrochlorothiazide (PRINZIDE,ZESTORETIC) 10-12.5 MG tablet, TAKE 1 TABLET BY MOUTH  DAILY, Disp: , Rfl:    Niacinamide-Zn-Cu-Methfo-Se-Cr (NICOTINAMIDE PO), Take by mouth., Disp: , Rfl:    Polypodium Leucotomos (HELIOCARE PO), Take by mouth., Disp: , Rfl:      ROS:  Review of Systems  Constitutional:  Negative for fatigue, fever and unexpected weight change.  Respiratory:  Negative for cough, shortness of breath and wheezing.   Cardiovascular:  Negative for chest pain, palpitations and leg  swelling.  Gastrointestinal:  Negative for blood in stool, constipation, diarrhea, nausea and vomiting.  Endocrine: Negative for cold intolerance, heat intolerance and polyuria.  Genitourinary:  Negative for dyspareunia, dysuria, flank pain, frequency, genital sores, hematuria, menstrual problem, pelvic pain, urgency, vaginal bleeding, vaginal discharge and vaginal pain.  Musculoskeletal:  Positive for arthralgias. Negative for back pain, joint swelling and myalgias.  Skin:  Negative for rash.  Neurological:  Negative for dizziness, syncope, light-headedness, numbness and headaches.  Hematological:  Negative for adenopathy.  Psychiatric/Behavioral:  Negative for agitation, confusion, sleep disturbance and suicidal ideas. The patient is not nervous/anxious.   BREAST: No symptoms    Objective: BP 140/80    Ht 5' 1.5" (1.562 m)    Wt (!) 325 lb (147.4 kg)    BMI 60.41 kg/m    Physical Exam Constitutional:      Appearance: She is well-developed.  Genitourinary:     Vulva normal.     Right Labia: No rash, tenderness or lesions.    Left Labia: No tenderness, lesions or rash.    No vaginal discharge, erythema or tenderness.      Right Adnexa: not tender and no mass present.    Left Adnexa: not tender and no mass present.    No cervical friability or polyp.     Uterus is not enlarged or tender.  Breasts:    Right: No mass, nipple discharge, skin change or tenderness.     Left: No mass, nipple discharge, skin change or tenderness.  Neck:     Thyroid: No thyromegaly.  Cardiovascular:     Rate and Rhythm: Normal rate and regular rhythm.     Heart sounds: Normal heart sounds. No murmur heard. Pulmonary:     Effort: Pulmonary effort is normal.     Breath sounds: Normal breath sounds.  Abdominal:     Palpations: Abdomen is soft.     Tenderness: There is no abdominal tenderness. There is no guarding or rebound.  Musculoskeletal:        General: Normal range of motion.     Cervical  back: Normal range of motion.  Lymphadenopathy:     Cervical: No cervical adenopathy.  Neurological:     General: No focal deficit present.     Mental Status: She is alert and oriented to person, place, and time.     Cranial Nerves: No cranial nerve deficit.  Skin:    General: Skin is warm and dry.  Psychiatric:        Mood and Affect: Mood normal.        Behavior: Behavior normal.  Thought Content: Thought content normal.        Judgment: Judgment normal.  Vitals reviewed.    Assessment/Plan:  Encounter for annual routine gynecological examination  Encounter for screening mammogram for malignant neoplasm of breast; pt has mammo appt  Family history of breast cancer - Plan: Integrated BRACAnalysis ([place]); MyRisk update testing discussed and done today. Will call with results. Handout given.          GYN counsel breast self exam, mammography screening, menopause, adequate intake of calcium and vitamin D, diet and exercise    F/U  Return in about 1 year (around 06/01/2022).  Sahara Fujimoto B. Mckenzey Parcell, PA-C 06/01/2021 1:45 PM

## 2021-06-02 DIAGNOSIS — Z1371 Encounter for nonprocreative screening for genetic disease carrier status: Secondary | ICD-10-CM

## 2021-06-02 HISTORY — DX: Encounter for nonprocreative screening for genetic disease carrier status: Z13.71

## 2021-06-11 ENCOUNTER — Other Ambulatory Visit: Payer: Self-pay

## 2021-06-11 ENCOUNTER — Ambulatory Visit
Admission: RE | Admit: 2021-06-11 | Discharge: 2021-06-11 | Disposition: A | Discharge status: Home / Self Care | Payer: 59 | Source: Ambulatory Visit | Attending: Family Medicine | Admitting: Family Medicine

## 2021-06-11 DIAGNOSIS — Z1231 Encounter for screening mammogram for malignant neoplasm of breast: Secondary | ICD-10-CM | POA: Insufficient documentation

## 2021-06-21 DIAGNOSIS — Z23 Encounter for immunization: Secondary | ICD-10-CM | POA: Diagnosis not present

## 2021-06-21 DIAGNOSIS — Z148 Genetic carrier of other disease: Secondary | ICD-10-CM | POA: Diagnosis not present

## 2021-06-21 DIAGNOSIS — Z0000 Encounter for general adult medical examination without abnormal findings: Secondary | ICD-10-CM | POA: Diagnosis not present

## 2021-06-21 DIAGNOSIS — E119 Type 2 diabetes mellitus without complications: Secondary | ICD-10-CM | POA: Diagnosis not present

## 2021-06-24 ENCOUNTER — Encounter: Payer: Self-pay | Admitting: Obstetrics and Gynecology

## 2021-07-06 ENCOUNTER — Telehealth: Payer: Self-pay

## 2021-07-06 NOTE — Telephone Encounter (Signed)
Patient is returning missed call. Please advise 

## 2021-07-07 NOTE — Telephone Encounter (Signed)
Pt aware of neg MyRisk results. IBIS=11.3%/riskscore=13.1%. No further screening recommendations at this time. ? ?Patient understands these results only apply to her and her children, and this is not indicative of genetic testing results of her other family members. It is recommended that her other family members have genetic testing done. ? ?Pt also understands negative genetic testing doesn't mean she will never get any of these cancers.  ? ?Hard copy mailed to pt. F/u prn.  ? ?

## 2021-08-27 DIAGNOSIS — R002 Palpitations: Secondary | ICD-10-CM | POA: Diagnosis not present

## 2021-08-27 DIAGNOSIS — I10 Essential (primary) hypertension: Secondary | ICD-10-CM | POA: Diagnosis not present

## 2021-11-18 ENCOUNTER — Ambulatory Visit: Payer: 59 | Admitting: Dermatology

## 2022-02-10 ENCOUNTER — Ambulatory Visit: Payer: 59 | Admitting: Dermatology

## 2022-02-10 ENCOUNTER — Encounter: Payer: Self-pay | Admitting: Dermatology

## 2022-02-10 DIAGNOSIS — Z85828 Personal history of other malignant neoplasm of skin: Secondary | ICD-10-CM

## 2022-02-10 DIAGNOSIS — C4490 Unspecified malignant neoplasm of skin, unspecified: Secondary | ICD-10-CM

## 2022-02-10 DIAGNOSIS — L814 Other melanin hyperpigmentation: Secondary | ICD-10-CM

## 2022-02-10 DIAGNOSIS — L578 Other skin changes due to chronic exposure to nonionizing radiation: Secondary | ICD-10-CM | POA: Diagnosis not present

## 2022-02-10 DIAGNOSIS — L821 Other seborrheic keratosis: Secondary | ICD-10-CM

## 2022-02-10 DIAGNOSIS — D1801 Hemangioma of skin and subcutaneous tissue: Secondary | ICD-10-CM

## 2022-02-10 DIAGNOSIS — Z1283 Encounter for screening for malignant neoplasm of skin: Secondary | ICD-10-CM | POA: Diagnosis not present

## 2022-02-10 DIAGNOSIS — C44792 Other specified malignant neoplasm of skin of right lower limb, including hip: Secondary | ICD-10-CM

## 2022-02-10 DIAGNOSIS — D492 Neoplasm of unspecified behavior of bone, soft tissue, and skin: Secondary | ICD-10-CM

## 2022-02-10 DIAGNOSIS — C44712 Basal cell carcinoma of skin of right lower limb, including hip: Secondary | ICD-10-CM

## 2022-02-10 DIAGNOSIS — L570 Actinic keratosis: Secondary | ICD-10-CM

## 2022-02-10 HISTORY — DX: Unspecified malignant neoplasm of skin, unspecified: C44.90

## 2022-02-10 MED ORDER — FLUOROURACIL 5 % EX CREA: TOPICAL_CREAM | CUTANEOUS | 1 refills | Status: DC

## 2022-02-10 MED ORDER — CALCIPOTRIENE 0.005 % EX CREA: TOPICAL_CREAM | CUTANEOUS | 1 refills | Status: AC

## 2022-02-10 NOTE — Progress Notes (Signed)
Follow-Up Visit   Subjective  Rose Mendez is a 66 y.o. female who presents for the following: Annual Exam (Hx of BCC', Hx of SCC's, Hx of AKs. S/P 5FU-Calcipotriene treatment to arms and hands since last visit).  The patient presents for Total-Body Skin Exam (TBSE) for skin cancer screening and mole check.  The patient has spots, moles and lesions to be evaluated, some may be new or changing and the patient has concerns that these could be cancer.   The following portions of the chart were reviewed this encounter and updated as appropriate:  Tobacco  Allergies  Meds  Problems  Med Hx  Surg Hx  Fam Hx      Review of Systems: No other skin or systemic complaints except as noted in HPI or Assessment and Plan.   Objective  Well appearing patient in no apparent distress; mood and affect are within normal limits.  A full examination was performed including head, eyes, ears, nose, lips, neck, chest, axillae, abdomen, buttocks, front of lower and upper extremities, hands, feet, fingers, toes, fingernails, and toenails. All findings within normal limits unless otherwise noted below.  left arm x3 (3) Erythematous thin papules/macules with gritty scale.   Right pretibia 0.6 cm scaly pink papule     Right Knee - Lateral 0.9cm scaly pink papule      Assessment & Plan   History of Basal Cell Carcinoma of the Skin - No evidence of recurrence today - Recommend regular full body skin exams - Recommend daily broad spectrum sunscreen SPF 30+ to sun-exposed areas, reapply every 2 hours as needed.  - Call if any new or changing lesions are noted between office visits  History of Squamous Cell Carcinoma of the Skin - No evidence of recurrence today - No lymphadenopathy - Recommend regular full body skin exams - Recommend daily broad spectrum sunscreen SPF 30+ to sun-exposed areas, reapply every 2 hours as needed.  - Call if any new or changing lesions are noted between  office visits  Lentigines - Scattered tan macules - Due to sun exposure - Benign-appearing, observe - Recommend daily broad spectrum sunscreen SPF 30+ to sun-exposed areas, reapply every 2 hours as needed. - Call for any changes  Seborrheic Keratoses - Stuck-on, waxy, tan-brown papules and/or plaques  - Benign-appearing - Discussed benign etiology and prognosis. - Observe - Call for any changes  Melanocytic Nevi - Tan-brown and/or pink-flesh-colored symmetric macules and papules - Benign appearing on exam today - Observation - Call clinic for new or changing moles - Recommend daily use of broad spectrum spf 30+ sunscreen to sun-exposed areas.   Hemangiomas - Red papules - Discussed benign nature - Observe - Call for any changes  Actinic Damage - Severe, confluent actinic changes with pre-cancerous actinic keratoses, Extensive and thick - Severe, chronic, not at goal, secondary to cumulative UV radiation exposure over time - diffuse scaly erythematous macules and papules with underlying dyspigmentation - Discussed Prescription "Field Treatment" for Severe, Chronic Confluent Actinic Changes with Pre-Cancerous Actinic Keratoses Field treatment involves treatment of an entire area of skin that has confluent Actinic Changes (Sun/ Ultraviolet light damage) and PreCancerous Actinic Keratoses by method of PhotoDynamic Therapy (PDT) and/or prescription Topical Chemotherapy agents such as 5-fluorouracil, 5-fluorouracil/calcipotriene, and/or imiquimod.  The purpose is to decrease the number of clinically evident and subclinical PreCancerous lesions to prevent progression to development of skin cancer by chemically destroying early precancer changes that may or may not be visible.  It has  been shown to reduce the risk of developing skin cancer in the treated area. As a result of treatment, redness, scaling, crusting, and open sores may occur during treatment course. One or more than one of these  methods may be used and may have to be used several times to control, suppress and eliminate the PreCancerous changes. Discussed treatment course, expected reaction, and possible side effects. - Recommend daily broad spectrum sunscreen SPF 30+ to sun-exposed areas, reapply every 2 hours as needed.  - Staying in the shade or wearing long sleeves, sun glasses (UVA+UVB protection) and wide brim hats (4-inch brim around the entire circumference of the hat) are also recommended. - Call for new or changing lesions.  Start Fluorouracil 5% cream followed by thin layer of calcipotriene cream to hands and forearms twice daily for 7 days, then upper arms, for 7 days,  then chest for 7 days.   Pt defers red light PDT. May consider TCA peels in future.  Skin cancer screening performed today. Will check scalp and back of body at next appointment.  AK (actinic keratosis) (3) left arm x3  Actinic keratoses are precancerous spots that appear secondary to cumulative UV radiation exposure/sun exposure over time. They are chronic with expected duration over 1 year. A portion of actinic keratoses will progress to squamous cell carcinoma of the skin. It is not possible to reliably predict which spots will progress to skin cancer and so treatment is recommended to prevent development of skin cancer.  Recommend daily broad spectrum sunscreen SPF 30+ to sun-exposed areas, reapply every 2 hours as needed.  Recommend staying in the shade or wearing long sleeves, sun glasses (UVA+UVB protection) and wide brim hats (4-inch brim around the entire circumference of the hat). Call for new or changing lesions.   Destruction of lesion - left arm x3  Destruction method: cryotherapy   Informed consent: discussed and consent obtained   Lesion destroyed using liquid nitrogen: Yes   Region frozen until ice ball extended beyond lesion: Yes   Outcome: patient tolerated procedure well with no complications   Post-procedure  details: wound care instructions given   Additional details:  Prior to procedure, discussed risks of blister formation, small wound, skin dyspigmentation, or rare scar following cryotherapy. Recommend Vaseline ointment to treated areas while healing.   Actinic skin damage  Related Medications fluorouracil (EFUDEX) 5 % cream Apply thin layer followed by calcipotriene twice daily for 7 days as directed. Review office handout prior to use.  calcipotriene (DOVONOX) 0.005 % cream Apply twice daily following a layer of 5-fluorouracil twice daily for 7 days  Neoplasm of skin (2) Right pretibia  Skin / nail biopsy Type of biopsy: tangential   Informed consent: discussed and consent obtained   Anesthesia: the lesion was anesthetized in a standard fashion   Anesthesia comment:  Area prepped with alcohol Anesthetic:  1% lidocaine w/ epinephrine 1-100,000 buffered w/ 8.4% NaHCO3 Instrument used: flexible razor blade   Hemostasis achieved with: pressure, aluminum chloride and electrodesiccation   Outcome: patient tolerated procedure well   Post-procedure details: wound care instructions given   Post-procedure details comment:  Ointment and small bandage applied  Specimen 1 - Surgical pathology Differential Diagnosis: R/O BCC  Check Margins: No  Right Knee - Lateral  Skin / nail biopsy Type of biopsy: tangential   Informed consent: discussed and consent obtained   Anesthesia: the lesion was anesthetized in a standard fashion   Anesthesia comment:  Area prepped with alcohol Anesthetic:  1% lidocaine w/ epinephrine 1-100,000 buffered w/ 8.4% NaHCO3 Instrument used: flexible razor blade   Hemostasis achieved with: pressure, aluminum chloride and electrodesiccation   Outcome: patient tolerated procedure well   Post-procedure details: wound care instructions given   Post-procedure details comment:  Ointment and small bandage applied  Specimen 2 - Surgical pathology Differential Diagnosis:  R/O BCC vs SCC  Check Margins: No   Return for AK Follow Up 5-6 weeks. Will check scalp and back of body at that appointment.Loraine Maple, CMA, am acting as scribe for Forest Gleason, MD.  Documentation: I have reviewed the above documentation for accuracy and completeness, and I agree with the above.  Forest Gleason, MD

## 2022-02-10 NOTE — Patient Instructions (Addendum)
Cryotherapy Aftercare  Wash gently with soap and water everyday.   Apply Vaseline and Band-Aid daily until healed.   Wound Care Instructions  Cleanse wound gently with soap and water once a day then pat dry with clean gauze. Apply a thin coat of Petrolatum (petroleum jelly, "Vaseline") over the wound (unless you have an allergy to this). We recommend that you use a new, sterile tube of Vaseline. Do not pick or remove scabs. Do not remove the yellow or white "healing tissue" from the base of the wound.  Cover the wound with fresh, clean, nonstick gauze and secure with paper tape. You may use Band-Aids in place of gauze and tape if the wound is small enough, but would recommend trimming much of the tape off as there is often too much. Sometimes Band-Aids can irritate the skin.  You should call the office for your biopsy report after 1 week if you have not already been contacted.  If you experience any problems, such as abnormal amounts of bleeding, swelling, significant bruising, significant pain, or evidence of infection, please call the office immediately.  FOR ADULT SURGERY PATIENTS: If you need something for pain relief you may take 1 extra strength Tylenol (acetaminophen) AND 2 Ibuprofen ('200mg'$  each) together every 4 hours as needed for pain. (do not take these if you are allergic to them or if you have a reason you should not take them.) Typically, you may only need pain medication for 1 to 3 days.    Your prescription was sent to [facility] in [place]. A representative from [employer] will contact you within 2 business hours to verify your address and insurance information to schedule a free delivery. If for any reason you do not receive a phone call from them, please reach out to them. Their phone number is [phone] and their hours are Monday-Friday 9:00 am-5:00 pm.    Start Fluorouracil 5% cream thin layer to hands and forearms twice daily for 7 days, then upper arms, for 7  days,  then chest for 7 days. Follow with thin film of Calcipotriene cream.   Recommend taking Heliocare sun protection supplement daily in sunny weather for additional sun protection. For maximum protection on the sunniest days, you can take up to 2 capsules of regular Heliocare OR take 1 capsule of Heliocare Ultra. For prolonged exposure (such as a full day in the sun), you can repeat your dose of the supplement 4 hours after your first dose. Heliocare can be purchased at [employer], at some [facility] or at [email or web address].     Recommend daily broad spectrum sunscreen SPF 30+ to sun-exposed areas, reapply every 2 hours as needed. Call for new or changing lesions.  Staying in the shade or wearing long sleeves, sun glasses (UVA+UVB protection) and wide brim hats (4-inch brim around the entire circumference of the hat) are also recommended for sun protection.    Melanoma ABCDEs  Melanoma is the most dangerous type of skin cancer, and is the leading cause of death from skin disease.  You are more likely to develop melanoma if you: Have light-colored skin, light-colored eyes, or red or blond hair Spend a lot of time in the sun Tan regularly, either outdoors or in a tanning bed Have had blistering sunburns, especially during childhood Have a close family member who has had a melanoma Have atypical moles or large birthmarks  Early detection of melanoma is key since treatment is typically straightforward and cure rates are extremely high  if we catch it early.   The first sign of melanoma is often a change in a mole or a new dark spot.  The ABCDE system is a way of remembering the signs of melanoma.  A for asymmetry:  The two halves do not match. B for border:  The edges of the growth are irregular. C for color:  A mixture of colors are present instead of an even brown color. D for diameter:  Melanomas are usually (but not always) greater than 31m - the size of a pencil eraser. E for  evolution:  The spot keeps changing in size, shape, and color.  Please check your skin once per month between visits. You can use a small mirror in front and a large mirror behind you to keep an eye on the back side or your body.   If you see any new or changing lesions before your next follow-up, please call to schedule a visit.  Please continue daily skin protection including broad spectrum sunscreen SPF 30+ to sun-exposed areas, reapplying every 2 hours as needed when you're outdoors.   Staying in the shade or wearing long sleeves, sun glasses (UVA+UVB protection) and wide brim hats (4-inch brim around the entire circumference of the hat) are also recommended for sun protection.    Due to recent changes in healthcare laws, you may see results of your pathology and/or laboratory studies on MyChart before the doctors have had a chance to review them. We understand that in some cases there may be results that are confusing or concerning to you. Please understand that not all results are received at the same time and often the doctors may need to interpret multiple results in order to provide you with the best plan of care or course of treatment. Therefore, we ask that you please give u[place]2 business days to thoroughly review all your results before contacting the office for clarification. Should we see a critical lab result, you will be contacted sooner.   If You Need Anything After Your Visit  If you have any questions or concerns for your doctor, please call our main line at [phone] press option 4 to reach your doctor's medical assistant. If no one answers, please leave a voicemail as directed and we will return your call as soon as possible. Messages left after 4 pm will be answered the following business day.   You may also send [place] message via MLatty We typically respond to MyChart messages within 1-2 business days.  For prescription refills, please ask your pharmacy to contact our  office. Our fax number is [phone]  If you have an urgent issue when the clinic is closed that cannot wait until the next business day, you can page your doctor at the number below.    Please note that while we do our best to be available for urgent issues outside of office hours, we are not available 24/7.   If you have an urgent issue and are unable to reach u[place] you may choose to seek medical care at your doctor's office, retail clinic, urgent care center, or emergency room.  If you have a medical emergency, please immediately call 911 or go to the emergency department.  Pager Numbers  - Dr. KNehemiah Massed [phone] - Dr. MLaurence Ferrari [phone] - Dr. SNicole Kindred [phone] In the event of inclement weather, please call our main line at [phone] an update on the status of any delays or closures.  Dermatology  Medication Tips: Please keep the boxes that topical medications come in in order to help keep track of the instructions about where and how to use these. Pharmacies typically print the medication instructions only on the boxes and not directly on the medication tubes.   If your medication is too expensive, please contact our office at [phone] option 4 or send [place] a message through [place].   We are unable to tell what your co-pay for medications will be in advance as this is different depending on your insurance coverage. However, we may be able to find a substitute medication at lower cost or fill out paperwork to get insurance to cover a needed medication.   If a prior authorization is required to get your medication covered by your insurance company, please allow [place] 1-2 business days to complete this process.  Drug prices often vary depending on where the prescription is filled and some pharmacies may offer cheaper prices.  The website www.goodrx.com contains coupons for medications through different pharmacies. The prices here do not account for what the cost may  be with help from insurance (it may be cheaper with your insurance), but the website can give you the price if you did not use any insurance.  - You can print the associated coupon and take it with your prescription to the pharmacy.  - You may also stop by our office during regular business hours and pick up a GoodRx coupon card.  - If you need your prescription sent electronically to a different pharmacy, notify our office through [facility] or by phone at [phone] option 4.     Si Usted Necesita Algo Despus de Su Visita  Tambin puede enviarnos un mensaje a travs de Pharmacist, community. Por lo general respondemos a los mensajes de MyChart en el transcurso de 1 a 2 das hbiles.  Para renovar recetas, por favor pida a su farmacia que se ponga en contacto con nuestra oficina. Harland Dingwall de fax es [place] [phone].  Si tiene un asunto urgente cuando la clnica est cerrada y que no puede esperar hasta el siguiente da hbil, puede llamar/localizar a su doctor(a) al nmero que aparece a continuacin.   Por favor, tenga en cuenta que aunque hacemos todo lo posible para estar disponibles para asuntos urgentes fuera del horario de [place], no estamos disponibles las 24 horas del da, los 7 das de la [place].   Si tiene un problema urgente y no puede comunicarse con nosotros, puede optar por buscar atencin mdica  en el consultorio de su doctor(a), en una clnica privada, en un centro de atencin urgente o en una sala de emergencias.  Si tiene Engineering geologist, por favor llame inmediatamente al 911 o vaya a la sala de emergencias.  Nmeros de bper  - Dr. Nehemiah Massed: [phone]  - Dra. Moye: [phone]  - Dra. Nicole Kindred: [phone]  En caso de inclemencias del [place], por favor llame a Johnsie Kindred principal al [phone] para una actualizacin sobre el [place] de cualquier retraso o cierre.  Consejos para la medicacin en dermatologa: Por favor, guarde las cajas en las  que vienen los medicamentos de uso tpico para ayudarle a seguir las instrucciones sobre dnde y cmo usarlos. Las farmacias generalmente imprimen las instrucciones del medicamento slo en las cajas y no directamente en los tubos del [place].   Si su medicamento es muy caro, por favor, pngase en contacto con Zigmund Daniel llamando al [phone] y presione la opcin 4 o envenos un mensaje  a travs de MyChart.   No podemos decirle cul ser su copago por los medicamentos por adelantado ya que esto es diferente dependiendo de la cobertura de su seguro. Sin embargo, es posible que podamos encontrar un medicamento sustituto a Electrical engineer un formulario para que el seguro cubra el medicamento que se considera necesario.   Si se requiere una autorizacin previa para que su compaa de seguros [place] su medicamento, por favor permtanos de 1 a 2 das hbiles para completar este proceso.  Los precios de los medicamentos varan con frecuencia dependiendo del Environmental consultant de dnde se surte la receta y alguna farmacias pueden ofrecer precios ms baratos.  El sitio web www.goodrx.com tiene cupones para medicamentos de Airline pilot. Los precios aqu no tienen en cuenta lo que podra costar con la ayuda del seguro (puede ser ms barato con su seguro), pero el sitio web puede darle el precio si no utiliz Research scientist (physical sciences).  - Puede imprimir el cupn correspondiente y llevarlo con su receta a la farmacia.  - Tambin puede pasar por nuestra oficina durante el horario de atencin regular y Charity fundraiser una tarjeta de cupones de GoodRx.  - Si necesita que su receta se enve electrnicamente a una farmacia diferente, informe a nuestra oficina a travs de MyChart de [place] o por telfono llamando al [phone] y presione la opcin 4.

## 2022-02-11 ENCOUNTER — Encounter: Payer: Self-pay | Admitting: Dermatology

## 2022-02-15 ENCOUNTER — Telehealth: Payer: Self-pay

## 2022-02-15 DIAGNOSIS — C4499 Other specified malignant neoplasm of skin, unspecified: Secondary | ICD-10-CM

## 2022-02-15 NOTE — Telephone Encounter (Signed)
Discussed pathology results with patient. EDC scheduled 03/08/2022. Insurance will not cover [employer]. Will send referral to Dr. Winifred Olive at [facility]

## 2022-02-15 NOTE — Telephone Encounter (Signed)
-----   Message from Alfonso Patten, MD sent at 02/15/2022 11:55 AM EDT ----- 1. Skin , right pretibia BASOSQUAMOUS CARCINOMA "slightly more aggressive than BCC and SCC, recommend Mohs surgery for high cure rate while taking the least amount of skin" --> refer for Mohs  2. Skin , right knee-lateral BASAL CELL CARCINOMA, NODULAR PATTERN --> ED&C in the next 6 weeks  MAs please call with results, schedule for [facility] and refer for Mohs. Please let me know if they have any questions. Thank you!

## 2022-03-08 ENCOUNTER — Encounter: Payer: Self-pay | Admitting: Dermatology

## 2022-03-08 ENCOUNTER — Ambulatory Visit: Payer: 59 | Admitting: Dermatology

## 2022-03-08 DIAGNOSIS — C44712 Basal cell carcinoma of skin of right lower limb, including hip: Secondary | ICD-10-CM

## 2022-03-08 NOTE — Progress Notes (Signed)
   Follow-Up Visit   Subjective  Rose Mendez is a 66 y.o. female who presents for the following: Skin Cancer (Patient here for treatment of [place] on the right knee lateral. ).  The following portions of the chart were reviewed this encounter and updated as appropriate:  Tobacco  Allergies  Meds  Problems  Med Hx  Surg Hx  Fam Hx      Review of Systems: No other skin or systemic complaints except as noted in HPI or Assessment and Plan.   Objective  Well appearing patient in no apparent distress; mood and affect are within normal limits.  A focused examination was performed including right leg. Relevant physical exam findings are noted in the Assessment and Plan.  right knee lateral Ulcerated healing biopsy site with eschar   Assessment & Plan  Basal cell carcinoma (BCC) of skin of right lower extremity including hip right knee lateral  Destruction of lesion  Destruction method: electrodesiccation and curettage   Informed consent: discussed and consent obtained   Timeout:  patient name, date of birth, surgical site, and procedure verified Anesthesia: the lesion was anesthetized in a standard fashion   Anesthetic:  1% lidocaine w/ epinephrine 1-100,000 buffered w/ 8.4% NaHCO3 Curettage performed in three different directions: Yes   Electrodesiccation performed over the curetted area: Yes   Curettage cycles:  3 Final wound size (cm):  1.2 Hemostasis achieved with:  electrodesiccation Outcome: patient tolerated procedure well with no complications   Post-procedure details: sterile dressing applied and wound care instructions given   Dressing type: petrolatum     Return for AK Follow Up 4-8 weeks, Back exam.  I, Emelia Salisbury, CMA, am acting as scribe for Forest Gleason, MD.  Documentation: I have reviewed the above documentation for accuracy and completeness, and I agree with the above.  Forest Gleason, MD

## 2022-03-08 NOTE — Patient Instructions (Addendum)
Electrodesiccation and Curettage ("Scrape and Burn") Wound Care Instructions  Leave the original bandage on for 24 hours if possible.  If the bandage becomes soaked or soiled before that time, it is OK to remove it and examine the wound.  A small amount of post-operative bleeding is normal.  If excessive bleeding occurs, remove the bandage, place gauze over the site and apply continuous pressure (no peeking) over the area for 30 minutes. If this does not work, please call our clinic as soon as possible or page your doctor if it is after hours.   Once a day, cleanse the wound with soap and water. It is fine to shower. If a thick crust develops you may use a Q-tip dipped into dilute hydrogen peroxide (mix 1:1 with water) to dissolve it.  Hydrogen peroxide can slow the healing process, so use it only as needed.    After washing, apply petroleum jelly (Vaseline) or an antibiotic ointment if your doctor prescribed one for you, followed by a bandage.    For best healing, the wound should be covered with a layer of ointment at all times. If you are not able to keep the area covered with a bandage to hold the ointment in place, this may mean re-applying the ointment several times a day.  Continue this wound care until the wound has healed and is no longer open. It may take several weeks for the wound to heal and close.  Itching and mild discomfort is normal during the healing process.  If you have any discomfort, you can take Tylenol (acetaminophen) or ibuprofen as directed on the bottle. (Please do not take these if you have an allergy to them or cannot take them for another reason).  Some redness, tenderness and white or yellow material in the wound is normal healing.  If the area becomes very sore and red, or develops a thick yellow-green material (pus), it may be infected; please notify us.    Wound healing continues for up to one year following surgery. It is not unusual to experience pain in the scar  from time to time during the interval.  If the pain becomes severe or the scar thickens, you should notify the office.    A slight amount of redness in a scar is expected for the first six months.  After six months, the redness will fade and the scar will soften and fade.  The color difference becomes less noticeable with time.  If there are any problems, return for a post-op surgery check at your earliest convenience.  To improve the appearance of the scar, you can use silicone scar gel, cream, or sheets (such as Mederma or Serica) every night for up to one year. These are available over the counter (without a prescription).  Please call our office at [phone] for any questions or concerns.     Due to recent changes in healthcare laws, you may see results of your pathology and/or laboratory studies on MyChart before the doctors have had a chance to review them. We understand that in some cases there may be results that are confusing or concerning to you. Please understand that not all results are received at the same time and often the doctors may need to interpret multiple results in order to provide you with the best plan of care or course of treatment. Therefore, we ask that you please give us 2 business days to thoroughly review all your results before contacting the office for clarification. Should   we see a critical lab result, you will be contacted sooner.   If You Need Anything After Your Visit  If you have any questions or concerns for your doctor, please call our main line at [phone] and press option 4 to reach your doctor's medical assistant. If no one answers, please leave a voicemail as directed and we will return your call as soon as possible. Messages left after 4 pm will be answered the following business day.   You may also send us a message via MyChart. We typically respond to MyChart messages within 1-2 business days.  For prescription refills, please ask your  pharmacy to contact our office. Our fax number is [phone].  If you have an urgent issue when the clinic is closed that cannot wait until the next business day, you can page your doctor at the number below.    Please note that while we do our best to be available for urgent issues outside of office hours, we are not available 24/7.   If you have an urgent issue and are unable to reach us, you may choose to seek medical care at your doctor's office, retail clinic, urgent care center, or emergency room.  If you have a medical emergency, please immediately call 911 or go to the emergency department.  Pager Numbers  - Dr. Kowalski: [phone]  - Dr. Moye: [phone]  - Dr. Stewart: [phone]  In the event of inclement weather, please call our main line at [phone] for an update on the status of any delays or closures.  Dermatology Medication Tips: Please keep the boxes that topical medications come in in order to help keep track of the instructions about where and how to use these. Pharmacies typically print the medication instructions only on the boxes and not directly on the medication tubes.   If your medication is too expensive, please contact our office at [phone] option 4 or send us a message through MyChart.   We are unable to tell what your co-pay for medications will be in advance as this is different depending on your insurance coverage. However, we may be able to find a substitute medication at lower cost or fill out paperwork to get insurance to cover a needed medication.   If a prior authorization is required to get your medication covered by your insurance company, please allow us 1-2 business days to complete this process.  Drug prices often vary depending on where the prescription is filled and some pharmacies may offer cheaper prices.  The website www.goodrx.com contains coupons for medications through different pharmacies. The prices here do not  account for what the cost may be with help from insurance (it may be cheaper with your insurance), but the website can give you the price if you did not use any insurance.  - You can print the associated coupon and take it with your prescription to the pharmacy.  - You may also stop by our office during regular business hours and pick up a GoodRx coupon card.  - If you need your prescription sent electronically to a different pharmacy, notify our office through [facility] MyChart or by phone at [phone] option 4.     Si Usted Necesita Algo Despus de Su Visita  Tambin puede enviarnos un mensaje a travs de MyChart. Por lo general respondemos a los mensajes de MyChart en el transcurso de 1 a 2 das hbiles.  Para renovar recetas, por favor pida a su farmacia que se ponga en   contacto con nuestra oficina. Nuestro nmero de fax es el [phone].  Si tiene un asunto urgente cuando la clnica est cerrada y que no puede esperar hasta el siguiente da hbil, puede llamar/localizar a su doctor(a) al nmero que aparece a continuacin.   Por favor, tenga en cuenta que aunque hacemos todo lo posible para estar disponibles para asuntos urgentes fuera del horario de oficina, no estamos disponibles las 24 horas del da, los 7 das de la semana.   Si tiene un problema urgente y no puede comunicarse con nosotros, puede optar por buscar atencin mdica  en el consultorio de su doctor(a), en una clnica privada, en un centro de atencin urgente o en una sala de emergencias.  Si tiene una emergencia mdica, por favor llame inmediatamente al 911 o vaya a la sala de emergencias.  Nmeros de bper  - Dr. Kowalski: [phone]  - Dra. Moye: [phone]  - Dra. Stewart: [phone]  En caso de inclemencias del tiempo, por favor llame a nuestra lnea principal al [phone] para una actualizacin sobre el estado de cualquier retraso o cierre.  Consejos para la medicacin en dermatologa: Por  favor, guarde las cajas en las que vienen los medicamentos de uso tpico para ayudarle a seguir las instrucciones sobre dnde y cmo usarlos. Las farmacias generalmente imprimen las instrucciones del medicamento slo en las cajas y no directamente en los tubos del medicamento.   Si su medicamento es muy caro, por favor, pngase en contacto con nuestra oficina llamando al [phone] y presione la opcin 4 o envenos un mensaje a travs de MyChart.   No podemos decirle cul ser su copago por los medicamentos por adelantado ya que esto es diferente dependiendo de la cobertura de su seguro. Sin embargo, es posible que podamos encontrar un medicamento sustituto a menor costo o llenar un formulario para que el seguro cubra el medicamento que se considera necesario.   Si se requiere una autorizacin previa para que su compaa de seguros cubra su medicamento, por favor permtanos de 1 a 2 das hbiles para completar este proceso.  Los precios de los medicamentos varan con frecuencia dependiendo del lugar de dnde se surte la receta y alguna farmacias pueden ofrecer precios ms baratos.  El sitio web www.goodrx.com tiene cupones para medicamentos de diferentes farmacias. Los precios aqu no tienen en cuenta lo que podra costar con la ayuda del seguro (puede ser ms barato con su seguro), pero el sitio web puede darle el precio si no utiliz ningn seguro.  - Puede imprimir el cupn correspondiente y llevarlo con su receta a la farmacia.  - Tambin puede pasar por nuestra oficina durante el horario de atencin regular y recoger una tarjeta de cupones de GoodRx.  - Si necesita que su receta se enve electrnicamente a una farmacia diferente, informe a nuestra oficina a travs de MyChart de [place] o por telfono llamando al [phone] y presione la opcin 4.  

## 2022-03-12 ENCOUNTER — Encounter: Payer: Self-pay | Admitting: Dermatology

## 2022-03-22 ENCOUNTER — Ambulatory Visit: Payer: 59 | Admitting: Dermatology

## 2022-04-11 ENCOUNTER — Telehealth: Payer: Self-pay

## 2022-04-11 NOTE — Telephone Encounter (Signed)
Encounter notes from the [facility] scanned in under the media tab for your review

## 2022-04-19 ENCOUNTER — Telehealth: Payer: Self-pay

## 2022-04-19 NOTE — Telephone Encounter (Signed)
Specimen tracking and history updated from [facility]. aw

## 2022-04-25 IMAGING — MG MM DIGITAL SCREENING BILAT W/ TOMO AND CAD
6 of 12 series · 6 of 36 positions shown · non-contrast
Comparison: Previous exam(s).

CLINICAL DATA: Screening.

EXAM:
DIGITAL SCREENING BILATERAL MAMMOGRAM WITH TOMOSYNTHESIS AND CAD
TECHNIQUE: Bilateral screening digital craniocaudal and mediolateral oblique
mammograms were obtained. Bilateral screening digital breast
tomosynthesis was performed. The images were evaluated with
computer-aided detection.

[R CV synth-2D]
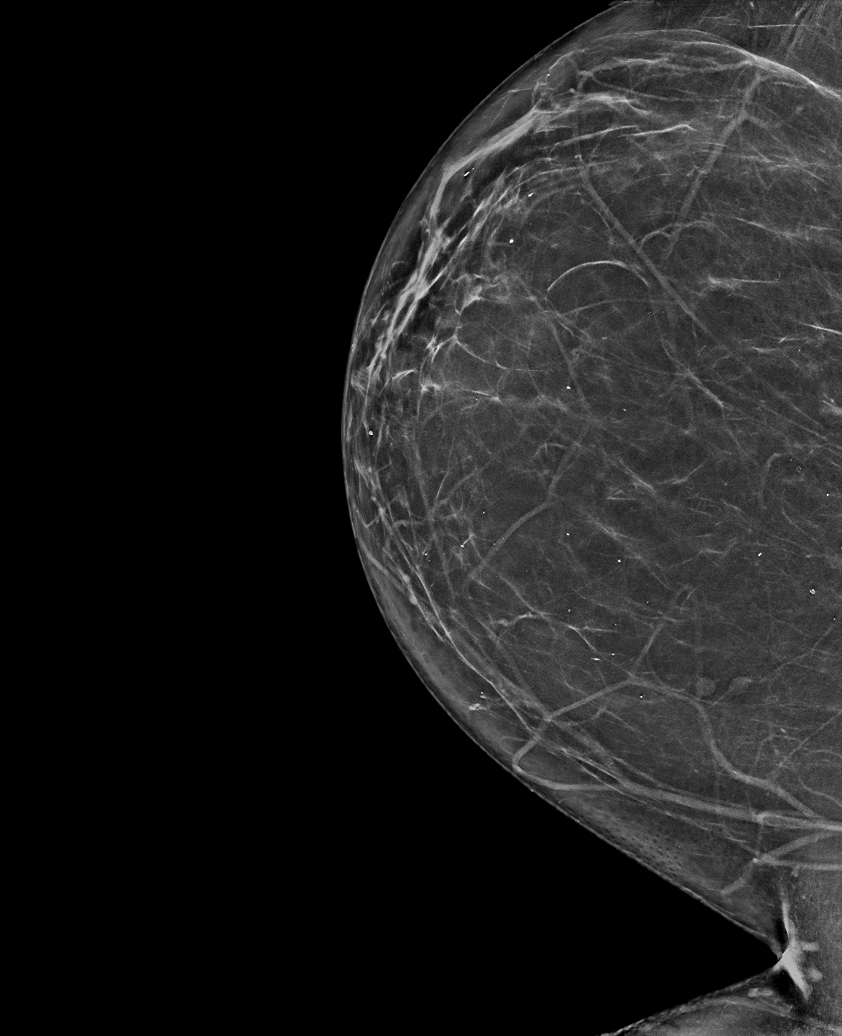

[L CV synth-2D]
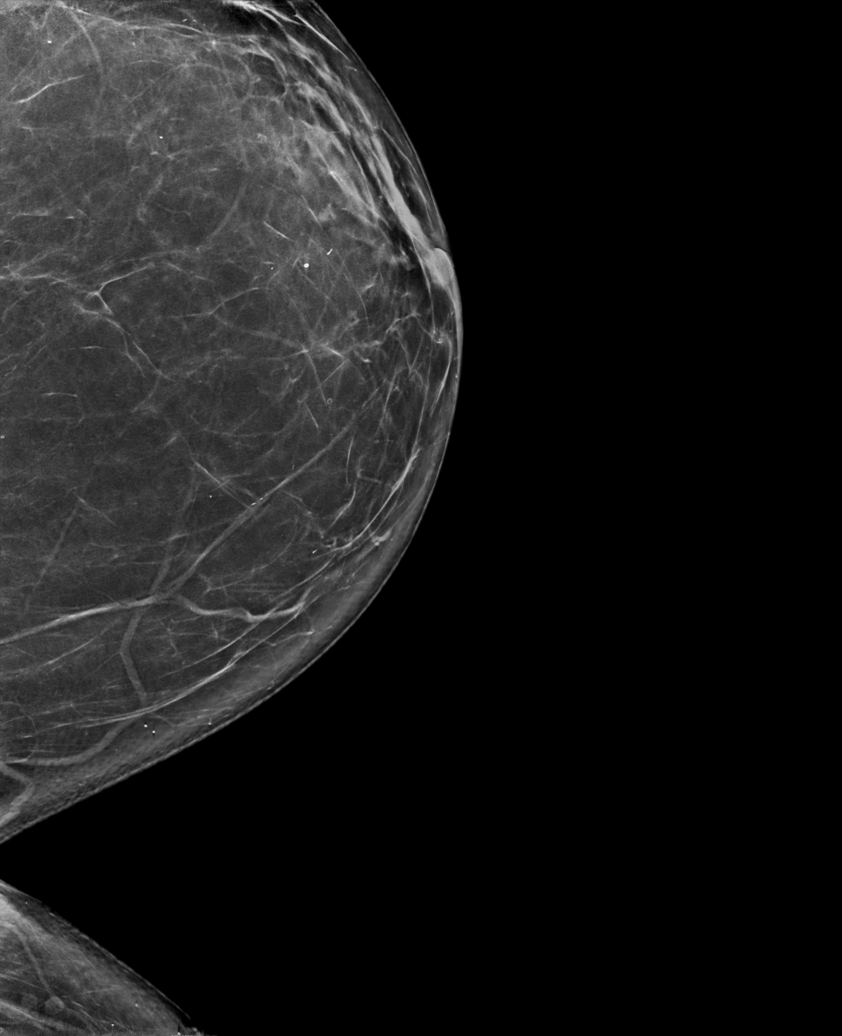

[R CC synth-2D]
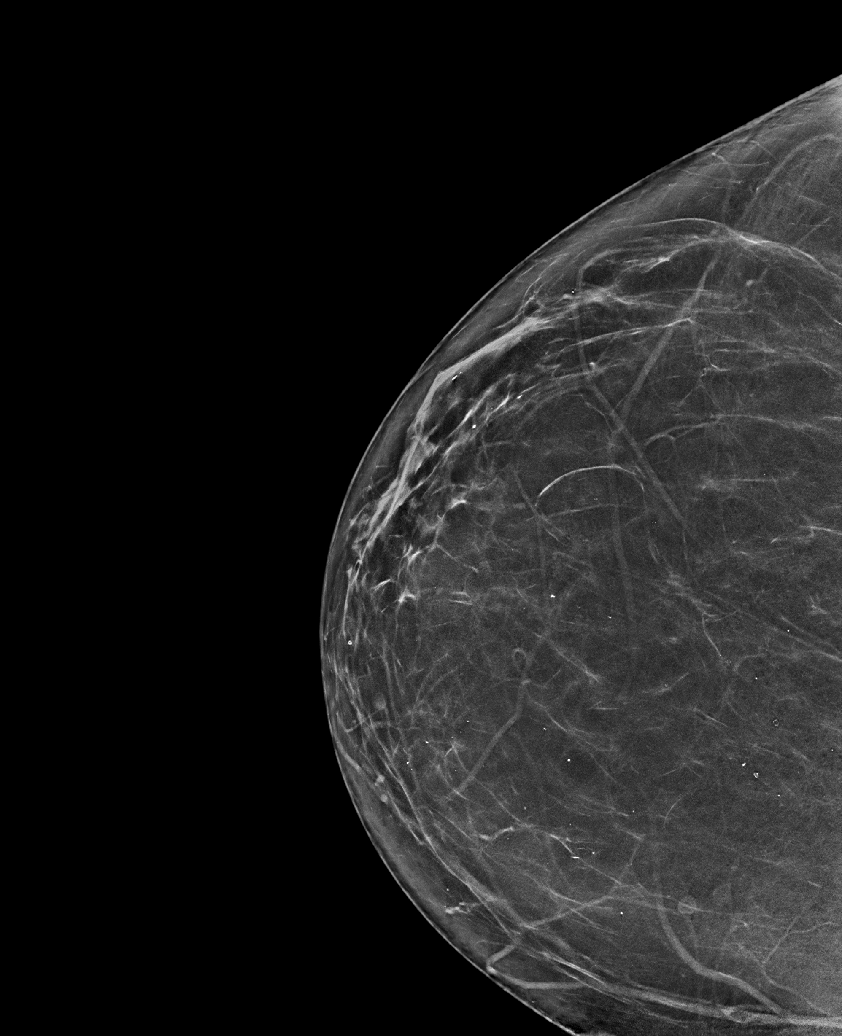

[R MLO synth-2D]
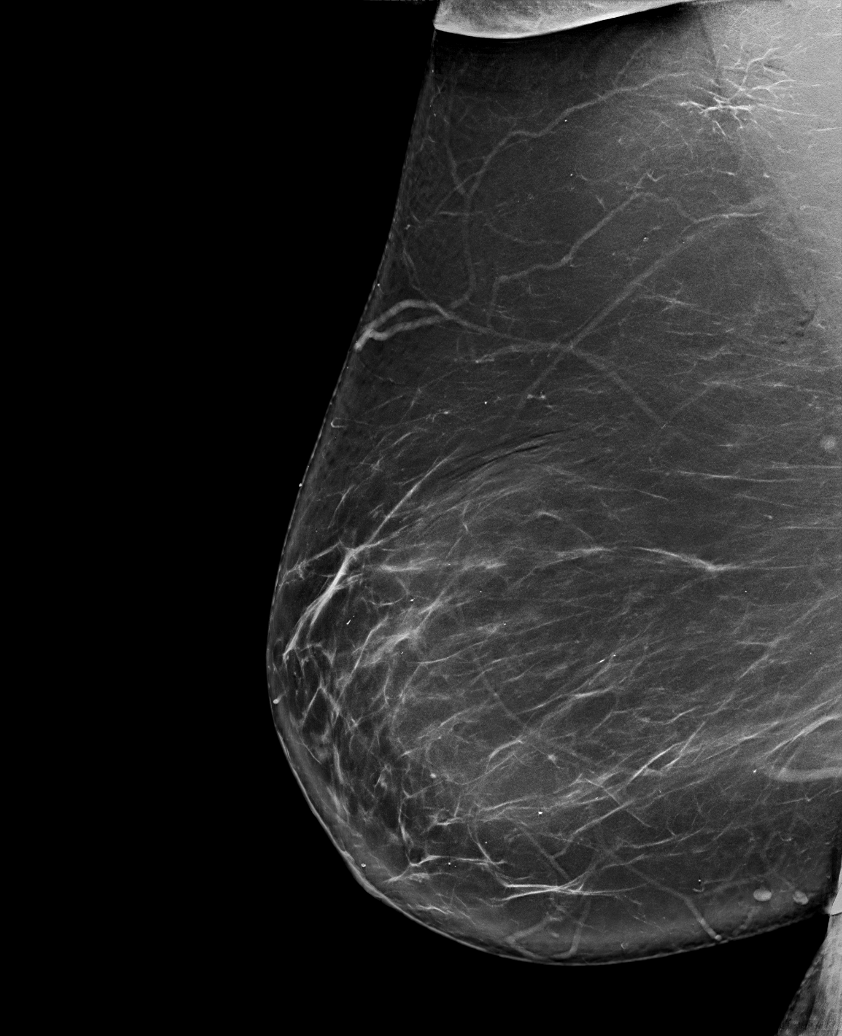

[L CC synth-2D]
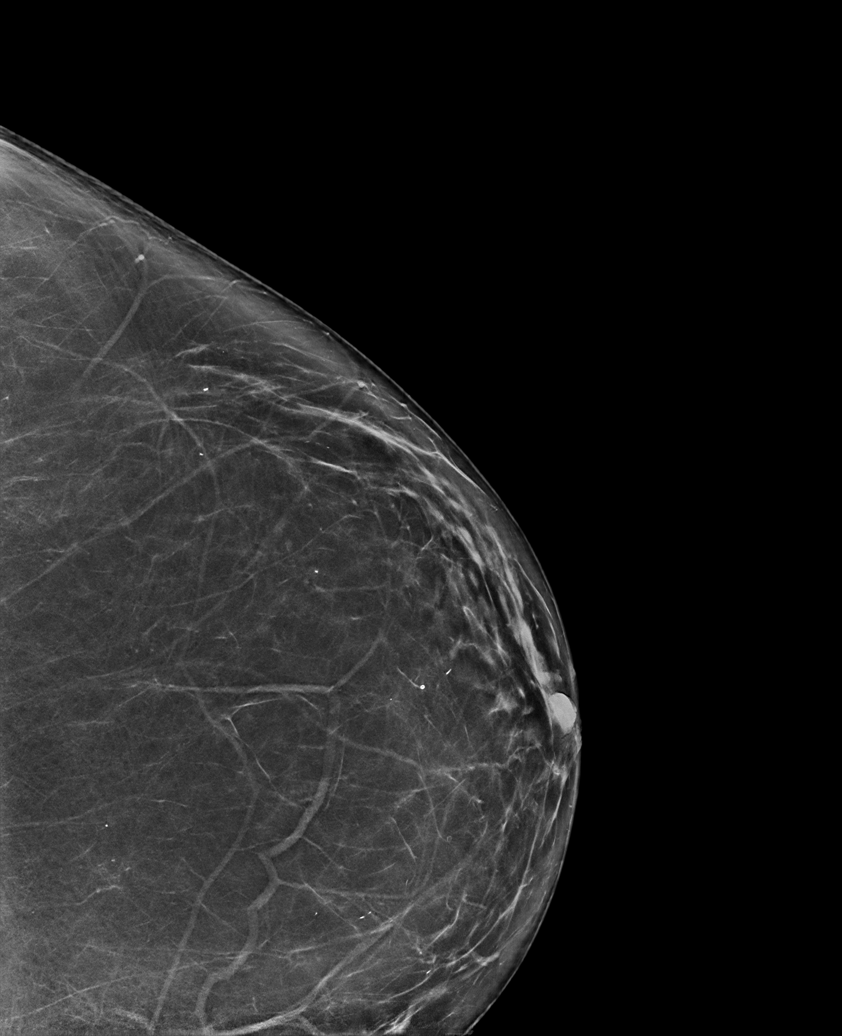

[L MLO synth-2D]
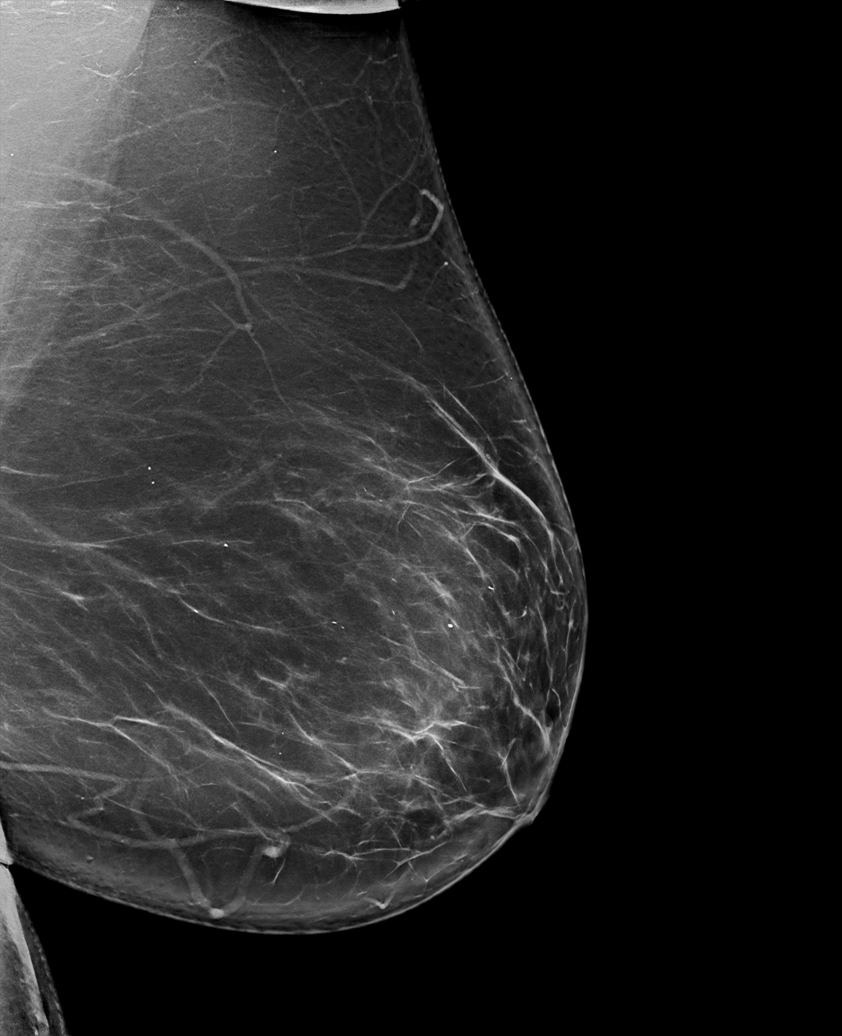

[6 of 36 positions shown; findings below may reference images not displayed]

ACR Breast Density Category b: There are scattered areas of
fibroglandular density.
FINDINGS: There are no findings suspicious for malignancy.
IMPRESSION: No mammographic evidence of malignancy. A result letter of this
screening mammogram will be mailed directly to the patient.

RECOMMENDATION:
Screening mammogram in one year. (Code:51-O-LD2)

BI-RADS CATEGORY  1: Negative.

## 2022-05-04 ENCOUNTER — Ambulatory Visit: Payer: 59 | Admitting: Dermatology

## 2022-05-12 ENCOUNTER — Ambulatory Visit (INDEPENDENT_AMBULATORY_CARE_PROVIDER_SITE_OTHER): Payer: 59 | Admitting: Dermatology

## 2022-05-12 DIAGNOSIS — C4491 Basal cell carcinoma of skin, unspecified: Secondary | ICD-10-CM

## 2022-05-12 DIAGNOSIS — C44519 Basal cell carcinoma of skin of other part of trunk: Secondary | ICD-10-CM

## 2022-05-12 DIAGNOSIS — L578 Other skin changes due to chronic exposure to nonionizing radiation: Secondary | ICD-10-CM | POA: Diagnosis not present

## 2022-05-12 DIAGNOSIS — L219 Seborrheic dermatitis, unspecified: Secondary | ICD-10-CM | POA: Diagnosis not present

## 2022-05-12 DIAGNOSIS — D492 Neoplasm of unspecified behavior of bone, soft tissue, and skin: Secondary | ICD-10-CM

## 2022-05-12 DIAGNOSIS — L570 Actinic keratosis: Secondary | ICD-10-CM | POA: Diagnosis not present

## 2022-05-12 HISTORY — DX: Basal cell carcinoma of skin, unspecified: C44.91

## 2022-05-12 NOTE — Progress Notes (Signed)
Follow-Up Visit   Subjective  Rose Mendez is a 67 y.o. female who presents for the following: Follow-up (Patient here today for AK follow up and back exam. ).  Patient has treated hands and forearms with 5FU/calcipotriene.   The following portions of the chart were reviewed this encounter and updated as appropriate:   Tobacco  Allergies  Meds  Problems  Med Hx  Surg Hx  Fam Hx      Review of Systems:  No other skin or systemic complaints except as noted in HPI or Assessment and Plan.  Objective  Well appearing patient in no apparent distress; mood and affect are within normal limits.  A focused examination was performed including arms, hands, face, back. Relevant physical exam findings are noted in the Assessment and Plan.  R upper arm x 1, R upper back x 2 (3) Erythematous thin papules/macules with gritty scale.   Scalp Pink patches with greasy scale.   Left Upper Back 0.8 cm pink papule R/o BCC     Left Mid Back 0.3 cm pink papule       Assessment & Plan  AK (actinic keratosis) (3) R upper arm x 1, R upper back x 2  Actinic keratoses are precancerous spots that appear secondary to cumulative UV radiation exposure/sun exposure over time. They are chronic with expected duration over 1 year. A portion of actinic keratoses will progress to squamous cell carcinoma of the skin. It is not possible to reliably predict which spots will progress to skin cancer and so treatment is recommended to prevent development of skin cancer.  Recommend daily broad spectrum sunscreen SPF 30+ to sun-exposed areas, reapply every 2 hours as needed.  Recommend staying in the shade or wearing long sleeves, sun glasses (UVA+UVB protection) and wide brim hats (4-inch brim around the entire circumference of the hat). Call for new or changing lesions.  Prior to procedure, discussed risks of blister formation, small wound, skin dyspigmentation, or rare scar following cryotherapy.  Recommend Vaseline ointment to treated areas while healing.  Hypertrophic at right upper arm, consider bx if not resolved.   Destruction of lesion - R upper arm x 1, R upper back x 2  Destruction method: cryotherapy   Informed consent: discussed and consent obtained   Lesion destroyed using liquid nitrogen: Yes   Cryotherapy cycles:  2 Outcome: patient tolerated procedure well with no complications   Post-procedure details: wound care instructions given    Seborrheic dermatitis Scalp  Seborrheic Dermatitis  -  is a chronic persistent rash characterized by pinkness and scaling most commonly of the mid face but also can occur on the scalp (dandruff), ears; mid chest, mid back and groin.  It tends to be exacerbated by stress and cooler weather.  People who have neurologic disease may experience new onset or exacerbation of existing seborrheic dermatitis.  The condition is not curable but treatable and can be controlled.  Recommend otc medicated shampoo like pyrithione zinc, massage into scalp and leave in for 10 minutes before rinsing out.   Neoplasm of skin (2) Left Upper Back  Epidermal / dermal shaving  Lesion diameter (cm):  0.8 Informed consent: discussed and consent obtained   Timeout: patient name, date of birth, surgical site, and procedure verified   Anesthesia: the lesion was anesthetized in a standard fashion   Anesthetic:  1% lidocaine w/ epinephrine 1-100,000 local infiltration Instrument used: flexible razor blade   Hemostasis achieved with: aluminum chloride   Outcome: patient  tolerated procedure well   Post-procedure details: wound care instructions given   Additional details:  Mupirocin and a bandage applied  Destruction of lesion  Destruction method: electrodesiccation and curettage   Informed consent: discussed and consent obtained   Timeout:  patient name, date of birth, surgical site, and procedure verified Anesthesia: the lesion was anesthetized in a  standard fashion   Anesthetic:  1% lidocaine w/ epinephrine 1-100,000 buffered w/ 8.4% NaHCO3 Curettage performed in three different directions: Yes   Electrodesiccation performed over the curetted area: Yes   Curettage cycles:  3 Final wound size (cm):  1.4 Hemostasis achieved with:  electrodesiccation Outcome: patient tolerated procedure well with no complications   Post-procedure details: sterile dressing applied and wound care instructions given   Dressing type: petrolatum    Specimen 1 - Surgical pathology Differential Diagnosis: R/o BCC  Check Margins: No 0.8 cm pink papule Treated with EDC   Left Mid Back  Epidermal / dermal shaving  Lesion diameter (cm):  0.3 Informed consent: discussed and consent obtained   Timeout: patient name, date of birth, surgical site, and procedure verified   Anesthesia: the lesion was anesthetized in a standard fashion   Anesthetic:  1% lidocaine w/ epinephrine 1-100,000 local infiltration Instrument used: flexible razor blade   Hemostasis achieved with: aluminum chloride   Outcome: patient tolerated procedure well   Post-procedure details: wound care instructions given   Additional details:  Mupirocin and a bandage applied  Destruction of lesion  Destruction method: electrodesiccation and curettage   Informed consent: discussed and consent obtained   Timeout:  patient name, date of birth, surgical site, and procedure verified Anesthesia: the lesion was anesthetized in a standard fashion   Anesthetic:  1% lidocaine w/ epinephrine 1-100,000 buffered w/ 8.4% NaHCO3 Curettage performed in three different directions: Yes   Electrodesiccation performed over the curetted area: Yes   Curettage cycles:  3 Final wound size (cm):  0.6 Hemostasis achieved with:  electrodesiccation Outcome: patient tolerated procedure well with no complications   Post-procedure details: sterile dressing applied and wound care instructions given   Dressing type:  petrolatum    Specimen 2 - Surgical pathology Differential Diagnosis: R/o BCC  Check Margins: No 0.3 cm pink papule Treated with EDC   Actinic Damage with PreCancerous Actinic Keratoses Counseling for Topical Chemotherapy Management: Patient exhibits: - Severe, confluent actinic changes with pre-cancerous actinic keratoses that is secondary to cumulative UV radiation exposure over time - Condition that is severe; chronic, not at goal. - diffuse scaly erythematous macules and papules with underlying dyspigmentation - Discussed Prescription "Field Treatment" topical Chemotherapy for Severe, Chronic Confluent Actinic Changes with Pre-Cancerous Actinic Keratoses Field treatment involves treatment of an entire area of skin that has confluent Actinic Changes (Sun/ Ultraviolet light damage) and PreCancerous Actinic Keratoses by method of PhotoDynamic Therapy (PDT) and/or prescription Topical Chemotherapy agents such as 5-fluorouracil, 5-fluorouracil/calcipotriene, and/or imiquimod.  The purpose is to decrease the number of clinically evident and subclinical PreCancerous lesions to prevent progression to development of skin cancer by chemically destroying early precancer changes that may or may not be visible.  It has been shown to reduce the risk of developing skin cancer in the treated area. As a result of treatment, redness, scaling, crusting, and open sores may occur during treatment course. One or more than one of these methods may be used and may have to be used several times to control, suppress and eliminate the PreCancerous changes. Discussed treatment course, expected reaction, and possible  side effects. - Recommend daily broad spectrum sunscreen SPF 30+ to sun-exposed areas, reapply every 2 hours as needed.  - Staying in the shade or wearing long sleeves, sun glasses (UVA+UVB protection) and wide brim hats (4-inch brim around the entire circumference of the hat) are also recommended. Pt  declines TCA peel.  - Start 5-fluorouracil/calcipotriene cream twice a day for  7 days to affected areas including upper arms, chest, repeat treatment to tops of hands, forearm. Prescription sent to [facility]. Patient advised they will receive an email to purchase the medication online and have it sent to their home. Patient provided with handout reviewing treatment course and side effects and advised to call or message [place] on MyChart with any concerns.  Reviewed course of treatment and expected reaction.  Patient advised to expect inflammation and crusting and advised that erosions are possible.  Patient advised to be diligent with sun protection during and after treatment. Counseled to keep medication out of reach of children and pets.   Return in about 6 months (around 11/10/2022) for TBSE, Hx BCC, Hx AK.  Graciella Belton, RMA, am acting as scribe for Forest Gleason, MD .  Documentation: I have reviewed the above documentation for accuracy and completeness, and I agree with the above.  Forest Gleason, MD

## 2022-05-12 NOTE — Patient Instructions (Addendum)
Wound Care Instructions  Cleanse wound gently with soap and water once a day then pat dry with clean gauze. Apply a thin coat of Petrolatum (petroleum jelly, "Vaseline") over the wound (unless you have an allergy to this). We recommend that you use a new, sterile tube of Vaseline. Do not pick or remove scabs. Do not remove the yellow or white "healing tissue" from the base of the wound.  Cover the wound with fresh, clean, nonstick gauze and secure with paper tape. You may use Band-Aids in place of gauze and tape if the wound is small enough, but would recommend trimming much of the tape off as there is often too much. Sometimes Band-Aids can irritate the skin.  You should call the office for your biopsy report after 1 week if you have not already been contacted.  If you experience any problems, such as abnormal amounts of bleeding, swelling, significant bruising, significant pain, or evidence of infection, please call the office immediately.  FOR ADULT SURGERY PATIENTS: If you need something for pain relief you may take 1 extra strength Tylenol (acetaminophen) AND 2 Ibuprofen ('200mg'$  each) together every 4 hours as needed for pain. (do not take these if you are allergic to them or if you have a reason you should not take them.) Typically, you may only need pain medication for 1 to 3 days.     5-Fluorouracil/Calcipotriene Patient Education   Actinic keratoses are the dry, red scaly spots on the skin caused by sun damage. A portion of these spots can turn into skin cancer with time, and treating them can help prevent development of skin cancer.   Treatment of these spots requires removal of the defective skin cells. There are various ways to remove actinic keratoses, including freezing with liquid nitrogen, treatment with creams, or treatment with a blue light procedure in the office.   5-fluorouracil cream is a topical cream used to treat actinic keratoses. It works by interfering with the growth  of abnormal fast-growing skin cells, such as actinic keratoses. These cells peel off and are replaced by healthy ones.   5-fluorouracil/calcipotriene is a combination of the 5-fluorouracil cream with a vitamin D analog cream called calcipotriene. The calcipotriene alone does not treat actinic keratoses. However, when it is combined with 5-fluorouracil, it helps the 5-fluorouracil treat the actinic keratoses much faster so that the same results can be achieved with a much shorter treatment time.  INSTRUCTIONS FOR 5-FLUOROURACIL/CALCIPOTRIENE CREAM:   5-fluorouracil/calcipotriene cream typically only needs to be used for 4-7 days. A thin layer should be applied twice a day to the treatment areas recommended by your physician.   If your physician prescribed you separate tubes of 5-fluourouracil and calcipotriene, apply a thin layer of 5-fluorouracil followed by a thin layer of calcipotriene.   Avoid contact with your eyes, nostrils, and mouth. Do not use 5-fluorouracil/calcipotriene cream on infected or open wounds.   You will develop redness, irritation and some crusting at areas where you have pre-cancer damage/actinic keratoses. IF YOU DEVELOP PAIN, BLEEDING, OR SIGNIFICANT CRUSTING, STOP THE TREATMENT EARLY - you have already gotten a good response and the actinic keratoses should clear up well.  Wash your hands after applying 5-fluorouracil 5% cream on your skin.   A moisturizer or sunscreen with a minimum SPF 30 should be applied each morning.   Once you have finished the treatment, you can apply a thin layer of Vaseline twice a day to irritated areas to soothe and calm the areas more  quickly. If you experience significant discomfort, contact your physician.  For some patients it is necessary to repeat the treatment for best results.  SIDE EFFECTS: When using 5-fluorouracil/calcipotriene cream, you may have mild irritation, such as redness, dryness, swelling, or a mild burning sensation.  This usually resolves within 2 weeks. The more actinic keratoses you have, the more redness and inflammation you can expect during treatment. Eye irritation has been reported rarely. If this occurs, please let us know.  If you have any trouble using this cream, please call the office. If you have any other questions about this information, please do not hesitate to ask me before you leave the office.  - Start 5-fluorouracil/calcipotriene cream twice a day for 7 days to affected areas including upper arms, chest, repeat treatment to tops of hands, forearms. Prescription sent to [facility]. Patient advised they will receive an email to purchase the medication online and have it sent to their home. Patient provided with handout reviewing treatment course and side effects and advised to call or message [place] on MyChart with any concerns.  Reviewed course of treatment and expected reaction.  Patient advised to expect inflammation and crusting and advised that erosions are possible.  Patient advised to be diligent with sun protection during and after treatment. Counseled to keep medication out of reach of children and pets.

## 2022-05-17 ENCOUNTER — Encounter: Payer: Self-pay | Admitting: Dermatology

## 2022-05-17 ENCOUNTER — Telehealth: Payer: Self-pay

## 2022-05-17 NOTE — Telephone Encounter (Addendum)
Calling patient regarding bx results. She verbalized understanding and denied further questions at this time.    ----- Message from Alfonso Patten, MD sent at 05/17/2022  2:36 PM EST ----- 1. 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 Skin , left mid back SUPERFICIAL BASAL CELL CARCINOMA --> ED&C already done, recheck at f/u  MAs please call. Thank you!

## 2022-05-18 ENCOUNTER — Other Ambulatory Visit: Payer: Self-pay | Admitting: Family Medicine

## 2022-05-18 DIAGNOSIS — Z1231 Encounter for screening mammogram for malignant neoplasm of breast: Secondary | ICD-10-CM

## 2022-06-13 ENCOUNTER — Ambulatory Visit
Admission: RE | Admit: 2022-06-13 | Discharge: 2022-06-13 | Disposition: A | Discharge status: Home / Self Care | Payer: Medicare HMO | Source: Ambulatory Visit | Attending: Family Medicine | Admitting: Family Medicine

## 2022-06-13 DIAGNOSIS — Z1231 Encounter for screening mammogram for malignant neoplasm of breast: Secondary | ICD-10-CM

## 2022-10-07 NOTE — Telephone Encounter (Signed)
Reviewed. MAs please confirm that patient is scheduled for FBSE and mark off in the biopsy book. Thank you! 

## 2023-01-12 ENCOUNTER — Encounter: Payer: 59 | Admitting: Dermatology

## 2023-01-21 DIAGNOSIS — L121 Cicatricial pemphigoid: Secondary | ICD-10-CM | POA: Insufficient documentation

## 2023-01-26 ENCOUNTER — Encounter: Payer: 59 | Admitting: Dermatology

## 2023-02-21 ENCOUNTER — Encounter: Payer: Self-pay | Admitting: Dermatology

## 2023-02-21 ENCOUNTER — Ambulatory Visit: Payer: Medicare HMO | Admitting: Dermatology

## 2023-02-21 DIAGNOSIS — L121 Cicatricial pemphigoid: Secondary | ICD-10-CM | POA: Diagnosis not present

## 2023-02-21 DIAGNOSIS — L578 Other skin changes due to chronic exposure to nonionizing radiation: Secondary | ICD-10-CM | POA: Diagnosis not present

## 2023-02-21 DIAGNOSIS — Z1283 Encounter for screening for malignant neoplasm of skin: Secondary | ICD-10-CM | POA: Diagnosis not present

## 2023-02-21 DIAGNOSIS — Z79899 Other long term (current) drug therapy: Secondary | ICD-10-CM

## 2023-02-21 DIAGNOSIS — L814 Other melanin hyperpigmentation: Secondary | ICD-10-CM

## 2023-02-21 DIAGNOSIS — D229 Melanocytic nevi, unspecified: Secondary | ICD-10-CM

## 2023-02-21 DIAGNOSIS — Z5111 Encounter for antineoplastic chemotherapy: Secondary | ICD-10-CM

## 2023-02-21 DIAGNOSIS — L821 Other seborrheic keratosis: Secondary | ICD-10-CM

## 2023-02-21 DIAGNOSIS — L570 Actinic keratosis: Secondary | ICD-10-CM

## 2023-02-21 DIAGNOSIS — D1801 Hemangioma of skin and subcutaneous tissue: Secondary | ICD-10-CM

## 2023-02-21 DIAGNOSIS — W908XXA Exposure to other nonionizing radiation, initial encounter: Secondary | ICD-10-CM | POA: Diagnosis not present

## 2023-02-21 DIAGNOSIS — Z85828 Personal history of other malignant neoplasm of skin: Secondary | ICD-10-CM

## 2023-02-21 DIAGNOSIS — Z872 Personal history of diseases of the skin and subcutaneous tissue: Secondary | ICD-10-CM

## 2023-02-21 DIAGNOSIS — Z7189 Other specified counseling: Secondary | ICD-10-CM

## 2023-02-21 NOTE — Progress Notes (Unsigned)
Follow-Up Visit   Subjective  Rose Mendez is a 67 y.o. female who presents for the following: Skin Cancer Screening and Full Body Skin Exam  The patient presents for Total-Body Skin Exam (TBSE) for skin cancer screening and mole check. The patient has spots, moles and lesions to be evaluated, some may be new or changing and the patient may have concern these could be cancer.  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 She is currently on dapsone 100 mg daily with no side effects. Patient advises she is tolerating well.   The following portions of the chart were reviewed this encounter and updated as appropriate: medications, allergies, medical history  Review of Systems:  No other skin or systemic complaints except as noted in HPI or Assessment and Plan.  Objective  Well appearing patient in no apparent distress; mood and affect are within normal limits.  A full examination was performed including scalp, head, eyes, ears, nose, lips, neck, chest, axillae, abdomen, back, buttocks, bilateral upper extremities, bilateral lower extremities, hands, feet, fingers, toes, fingernails, and toenails. All findings within normal limits unless otherwise noted below.   Relevant physical exam findings are noted in the Assessment and Plan.  R temple x 1, L sup forehead x 1, L dorsal hand x 7, R dorsal hand x 5, R forearm x 3, L forearm x 5 (22) Erythematous thin papules/macules with gritty scale.  Confluent at dorsal hands, arms and chest    Assessment & Plan   SKIN CANCER SCREENING PERFORMED TODAY.  ACTINIC DAMAGE - Chronic condition, secondary to cumulative UV/sun exposure - diffuse scaly erythematous macules with underlying dyspigmentation - Recommend daily broad spectrum sunscreen SPF 30+ to sun-exposed areas, reapply every 2 hours as needed.  - Staying in the shade or wearing long sleeves, sun glasses  (UVA+UVB protection) and wide brim hats (4-inch brim around the entire circumference of the hat) are also recommended for sun protection.  - Call for new or changing lesions.  LENTIGINES, SEBORRHEIC KERATOSES, HEMANGIOMAS - Benign normal skin lesions - Benign-appearing - Call for any changes  MELANOCYTIC NEVI - Tan-brown and/or pink-flesh-colored symmetric macules and papules - Benign appearing on exam today - Observation - Call clinic for new or changing moles - Recommend daily use of broad spectrum spf 30+ sunscreen to sun-exposed areas.   HISTORY OF SQUAMOUS CELL CARCINOMA OF THE SKIN - No evidence of recurrence today - No lymphadenopathy - Recommend regular full body skin exams - Recommend daily broad spectrum sunscreen SPF 30+ to sun-exposed areas, reapply every 2 hours as needed.  - Call if any new or changing lesions are noted between office visits  HISTORY OF BASAL CELL CARCINOMA OF THE SKIN - No evidence of recurrence today - Recommend regular full body skin exams - Recommend daily broad spectrum sunscreen SPF 30+ to sun-exposed areas, reapply every 2 hours as needed.  - Call if any new or changing lesions are noted between office visits  ACTINIC DAMAGE WITH PRECANCEROUS ACTINIC KERATOSES Counseling for Topical Chemotherapy Management: Patient exhibits: - Severe, confluent actinic changes with pre-cancerous actinic keratoses that is secondary to cumulative UV radiation exposure over time - Condition that is severe; chronic, not at goal. - diffuse scaly erythematous macules and papules with underlying dyspigmentation - Discussed Prescription "Field Treatment" topical Chemotherapy for Severe, Chronic Confluent Actinic Changes with Pre-Cancerous Actinic Keratoses Field treatment involves treatment of an entire area  of skin that has confluent Actinic Changes (Sun/ Ultraviolet light damage) and PreCancerous Actinic Keratoses by method of PhotoDynamic Therapy (PDT) and/or  prescription Topical Chemotherapy agents such as 5-fluorouracil, 5-fluorouracil/calcipotriene, and/or imiquimod.  The purpose is to decrease the number of clinically evident and subclinical PreCancerous lesions to prevent progression to development of skin cancer by chemically destroying early precancer changes that may or may not be visible.  It has been shown to reduce the risk of developing skin cancer in the treated area. As a result of treatment, redness, scaling, crusting, and open sores may occur during treatment course. One or more than one of these methods may be used and may have to be used several times to control, suppress and eliminate the PreCancerous changes. Discussed treatment course, expected reaction, and possible side effects. - Recommend daily broad spectrum sunscreen SPF 30+ to sun-exposed areas, reapply every 2 hours as needed.  - Staying in the shade or wearing long sleeves, sun glasses (UVA+UVB protection) and wide brim hats (4-inch brim around the entire circumference of the hat) are also recommended. - Call for new or changing lesions.  - Start 5-fluorouracil/calcipotriene cream twice a day until irritated to affected areas. Patient provided with handout reviewing treatment course and side effects and advised to call or message [place] on MyChart with any concerns.  Reviewed course of treatment and expected reaction.  Patient advised to expect inflammation and crusting and advised that erosions are possible.  Patient advised to be diligent with sun protection during and after treatment. Counseled to keep medication out of reach of children and pets.  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  10/13/22 [facility] Dr Orma Flaming note - Sent by ophthalmology after having multiple eyelashes removed, has early evidence of scarring on lower conjunctiva. DIF non-specific with IgA and C3. -  Patient feels like her eyelashes are scratching her lower lids when she removes her contacts - 07/13/22 ophthalmology note (Dr. 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 Georgeann Oppenheim): "Exam with stable scarring , mainly in the inferior fornix both eyes and the medial canthus , no new scarring"   12/26/22 labs reviewed: CBC diff (normal RBC HGB), CMP (normal liver enzymes)  Plan: -Diagnosis, treatment options, prognosis, risk/ benefit, and side effects were discussed with the patient  -Continues having eyelashes epilated with ophthalmology. Tolerating dapsone 100mg  every day with no side effects.  - continue dapsone 100mg  daily - will send reminder to get dapsone labs done around 11/20 - sent message to ophthalmologist Dr Ria Bush to establish collaboration   Ocular cicatricial pemphigoid Left Eye  Continue dapsone 100 mg once daily.   Patient advised we can follow her in our clinic and monitor blood work. If any complications, would recommend patient see Dr. Orma Flaming at [facility]. She is scheduled to see Dr. Reche Dixon at [facility] 04/04/23.  AK (actinic keratosis) (22) R temple x 1, L sup forehead x 1, L dorsal hand x 7, R dorsal hand x 5, R forearm x 3, L forearm x 5  Actinic keratoses are precancerous spots that appear secondary to cumulative UV radiation exposure/sun exposure over time. They are chronic with expected duration over 1 year. A portion of actinic keratoses will progress to squamous cell carcinoma of the skin. It is not possible to reliably predict which spots will progress to skin cancer and so treatment is recommended to prevent  development of skin cancer.  Recommend daily broad spectrum sunscreen SPF 30+ to sun-exposed areas, reapply every 2 hours as needed.  Recommend staying in the shade or wearing long sleeves, sun glasses (UVA+UVB protection) and wide brim hats (4-inch brim around the  entire circumference of the hat). Call for new or changing lesions.  Patient defers LN2 to thicker precancers at chest.   Destruction of lesion - R temple x 1, L sup forehead x 1, L dorsal hand x 7, R dorsal hand x 5, R forearm x 3, L forearm x 5 (22) Complexity: simple   Destruction method: cryotherapy   Informed consent: discussed and consent obtained   Timeout:  patient name, date of birth, surgical site, and procedure verified Lesion destroyed using liquid nitrogen: Yes   Region frozen until ice ball extended beyond lesion: Yes   Cryo cycles: 1 or 2. Outcome: patient tolerated procedure well with no complications   Post-procedure details: wound care instructions given     Return in about 3 months (around 05/24/2023) for AK follow up, with Dr. Katrinka Blazing.  Anise Salvo, RMA, am acting as scribe for Elie Goody, MD .   Documentation: I have reviewed the above documentation for accuracy and completeness, and I agree with the above.  Elie Goody, MD

## 2023-02-21 NOTE — Patient Instructions (Addendum)

## 2023-03-22 ENCOUNTER — Encounter: Payer: Self-pay | Admitting: Dermatology

## 2023-05-06 LAB — CBC WITH DIFFERENTIAL/PLATELET
Basophils Absolute: 0.1 10*3/uL (ref 0.0–0.2)
Basos: 1 %
EOS (ABSOLUTE): 0.2 10*3/uL (ref 0.0–0.4)
Eos: 2 %
Hematocrit: 35.6 % (ref 34.0–46.6)
Hemoglobin: 11.7 g/dL (ref 11.1–15.9)
Immature Grans (Abs): 0.1 10*3/uL (ref 0.0–0.1)
Immature Granulocytes: 1 %
Lymphocytes Absolute: 3.8 10*3/uL — ABNORMAL HIGH (ref 0.7–3.1)
Lymphs: 32 %
MCH: 31.5 pg (ref 26.6–33.0)
MCHC: 32.9 g/dL (ref 31.5–35.7)
MCV: 96 fL (ref 79–97)
Monocytes Absolute: 0.8 10*3/uL (ref 0.1–0.9)
Monocytes: 7 %
Neutrophils Absolute: 6.9 10*3/uL (ref 1.4–7.0)
Neutrophils: 57 %
Platelets: 361 10*3/uL (ref 150–450)
RBC: 3.72 x10E6/uL — ABNORMAL LOW (ref 3.77–5.28)
RDW: 11.8 % (ref 11.7–15.4)
WBC: 12 10*3/uL — ABNORMAL HIGH (ref 3.4–10.8)

## 2023-05-06 LAB — ALT: ALT: 11 [IU]/L (ref 0–32)

## 2023-05-06 LAB — AST: AST: 26 [IU]/L (ref 0–40)

## 2023-05-19 ENCOUNTER — Other Ambulatory Visit: Payer: Self-pay | Admitting: Family Medicine

## 2023-05-19 DIAGNOSIS — Z1231 Encounter for screening mammogram for malignant neoplasm of breast: Secondary | ICD-10-CM

## 2023-05-25 ENCOUNTER — Ambulatory Visit (INDEPENDENT_AMBULATORY_CARE_PROVIDER_SITE_OTHER): Payer: Medicare HMO | Admitting: Dermatology

## 2023-05-25 ENCOUNTER — Encounter: Payer: Self-pay | Admitting: Dermatology

## 2023-05-25 DIAGNOSIS — L570 Actinic keratosis: Secondary | ICD-10-CM

## 2023-05-25 DIAGNOSIS — L578 Other skin changes due to chronic exposure to nonionizing radiation: Secondary | ICD-10-CM

## 2023-05-25 DIAGNOSIS — W908XXA Exposure to other nonionizing radiation, initial encounter: Secondary | ICD-10-CM | POA: Diagnosis not present

## 2023-05-25 DIAGNOSIS — Z5111 Encounter for antineoplastic chemotherapy: Secondary | ICD-10-CM

## 2023-05-25 DIAGNOSIS — L121 Cicatricial pemphigoid: Secondary | ICD-10-CM

## 2023-05-25 MED ORDER — DAPSONE 100 MG PO TABS: 100.0000 mg | ORAL_TABLET | Freq: Every day | ORAL | 3 refills | Status: DC

## 2023-05-25 NOTE — Patient Instructions (Addendum)
Recommend restarting 5 fluorouracil calcipotriene cream twice daily to back of hands and forearm until you get a reaction then stop     5-Fluorouracil/Calcipotriene Patient Education   Actinic keratoses are the dry, red scaly spots on the skin caused by sun damage. A portion of these spots can turn into skin cancer with time, and treating them can help prevent development of skin cancer.   Treatment of these spots requires removal of the defective skin cells. There are various ways to remove actinic keratoses, including freezing with liquid nitrogen, treatment with creams, or treatment with a blue light procedure in the office.   5-fluorouracil cream is a topical cream used to treat actinic keratoses. It works by interfering with the growth of abnormal fast-growing skin cells, such as actinic keratoses. These cells peel off and are replaced by healthy ones.   5-fluorouracil/calcipotriene is a combination of the 5-fluorouracil cream with a vitamin D analog cream called calcipotriene. The calcipotriene alone does not treat actinic keratoses. However, when it is combined with 5-fluorouracil, it helps the 5-fluorouracil treat the actinic keratoses much faster so that the same results can be achieved with a much shorter treatment time.  INSTRUCTIONS FOR 5-FLUOROURACIL/CALCIPOTRIENE CREAM:   5-fluorouracil/calcipotriene cream typically only needs to be used for 4-7 days. A thin layer should be applied twice a day to the treatment areas recommended by your physician.   If your physician prescribed you separate tubes of 5-fluourouracil and calcipotriene, apply a thin layer of 5-fluorouracil followed by a thin layer of calcipotriene.   Avoid contact with your eyes, nostrils, and mouth. Do not use 5-fluorouracil/calcipotriene cream on infected or open wounds.   You will develop redness, irritation and some crusting at areas where you have pre-cancer damage/actinic keratoses. IF YOU DEVELOP PAIN,  BLEEDING, OR SIGNIFICANT CRUSTING, STOP THE TREATMENT EARLY - you have already gotten a good response and the actinic keratoses should clear up well.  Wash your hands after applying 5-fluorouracil 5% cream on your skin.   A moisturizer or sunscreen with a minimum SPF 30 should be applied each morning.   Once you have finished the treatment, you can apply a thin layer of Vaseline twice a day to irritated areas to soothe and calm the areas more quickly. If you experience significant discomfort, contact your physician.  For some patients it is necessary to repeat the treatment for best results.  SIDE EFFECTS: When using 5-fluorouracil/calcipotriene cream, you may have mild irritation, such as redness, dryness, swelling, or a mild burning sensation. This usually resolves within 2 weeks. The more actinic keratoses you have, the more redness and inflammation you can expect during treatment. Eye irritation has been reported rarely. If this occurs, please let us know.  If you have any trouble using this cream, please call the office. If you have any other questions about this information, please do not hesitate to ask me before you leave the office.   Due to recent changes in healthcare laws, you may see results of your pathology and/or laboratory studies on MyChart before the doctors have had a chance to review them. We understand that in some cases there may be results that are confusing or concerning to you. Please understand that not all results are received at the same time and often the doctors may need to interpret multiple results in order to provide you with the best plan of care or course of treatment. Therefore, we ask that you please give [place] 2 business days to thoroughly review  all your results before contacting the office for clarification. Should we see a critical lab result, you will be contacted sooner.   If You Need Anything After Your Visit  If you have any questions or concerns for your  doctor, please call our main line at [phone] and press option 4 to reach your doctor's medical assistant. If no one answers, please leave a voicemail as directed and we will return your call as soon as possible. Messages left after 4 pm will be answered the following business day.   You may also send [place] a message via MyChart. We typically respond to MyChart messages within 1-2 business days.  For prescription refills, please ask your pharmacy to contact our office. Our fax number is [phone].  If you have an urgent issue when the clinic is closed that cannot wait until the next business day, you can page your doctor at the number below.    Please note that while we do our best to be available for urgent issues outside of office hours, we are not available 24/7.   If you have an urgent issue and are unable to reach [place], you may choose to seek medical care at your doctor's office, retail clinic, urgent care center, or emergency room.  If you have a medical emergency, please immediately call 911 or go to the emergency department.  Pager Numbers  - Dr. Gwen Pounds: [phone]  - Dr. Roseanne Reno: [phone]  - Dr. Katrinka Blazing: [phone]   In the event of inclement weather, please call our main line at [phone] for an update on the status of any delays or closures.  Dermatology Medication Tips: Please keep the boxes that topical medications come in in order to help keep track of the instructions about where and how to use these. Pharmacies typically print the medication instructions only on the boxes and not directly on the medication tubes.   If your medication is too expensive, please contact our office at [phone] option 4 or send [place] a message through MyChart.   We are unable to tell what your co-pay for medications will be in advance as this is different depending on your insurance coverage. However, we may be able to find a substitute medication at lower cost or fill out  paperwork to get insurance to cover a needed medication.   If a prior authorization is required to get your medication covered by your insurance company, please allow [place] 1-2 business days to complete this process.  Drug prices often vary depending on where the prescription is filled and some pharmacies may offer cheaper prices.  The website www.goodrx.com contains coupons for medications through different pharmacies. The prices here do not account for what the cost may be with help from insurance (it may be cheaper with your insurance), but the website can give you the price if you did not use any insurance.  - You can print the associated coupon and take it with your prescription to the pharmacy.  - You may also stop by our office during regular business hours and pick up a GoodRx coupon card.  - If you need your prescription sent electronically to a different pharmacy, notify our office through [facility] or by phone at [phone] option 4.     Si Usted Necesita Algo Despus de Su Visita  Tambin puede enviarnos un mensaje a travs de Clinical cytogeneticist. Por lo general respondemos a los mensajes de MyChart en el transcurso de 1 a 2 das hbiles.  Para renovar  recetas, por favor pida a su farmacia que se ponga en contacto con nuestra oficina. Annie Sable de fax es [place] [phone].  Si tiene un asunto urgente cuando la clnica est cerrada y que no puede esperar hasta el siguiente da hbil, puede llamar/localizar a su doctor(a) al nmero que aparece a continuacin.   Por favor, tenga en cuenta que aunque hacemos todo lo posible para estar disponibles para asuntos urgentes fuera del horario de [place], no estamos disponibles las 24 horas del da, los 7 [address]  [address] de la [place].   Si tiene un problema urgente y no puede comunicarse con nosotros, puede optar por buscar atencin mdica  en el consultorio de su doctor(a), en una clnica privada, en un centro de atencin urgente o en una sala de  emergencias.  Si tiene Engineer, drilling, por favor llame inmediatamente al 911 o vaya a la sala de emergencias.  Nmeros de bper  - Dr. Gwen Pounds: [phone]  - Dra. Roseanne Reno: [phone]  - Dr. Katrinka Blazing: [phone]   En caso de inclemencias del tiempo, por favor llame a Lacy Duverney principal al [phone] para una actualizacin sobre el [place] de cualquier retraso o cierre.  Consejos para la medicacin en dermatologa: Por favor, guarde las cajas en las que vienen los medicamentos de uso tpico para ayudarle a seguir las instrucciones sobre dnde y cmo usarlos. Las farmacias generalmente imprimen las instrucciones del medicamento slo en las cajas y no directamente en los tubos del [place].   Si su medicamento es muy caro, por favor, pngase en contacto con Rolm Gala llamando al [phone] y presione la opcin 4 o envenos un mensaje a travs de Clinical cytogeneticist.   No podemos decirle cul ser su copago por los medicamentos por adelantado ya que esto es diferente dependiendo de la cobertura de su seguro. Sin embargo, es posible que podamos encontrar un medicamento sustituto a Audiological scientist un formulario para que el seguro cubra el medicamento que se considera necesario.   Si se requiere una autorizacin previa para que su compaa de seguros [place] su medicamento, por favor permtanos de 1 a 2 das hbiles para completar [address]  Los precios de los medicamentos varan con frecuencia dependiendo del Environmental consultant de dnde se surte la receta y alguna farmacias pueden ofrecer precios ms baratos.  El sitio web www.goodrx.com tiene cupones para medicamentos de Health and safety inspector. Los precios aqu no tienen en cuenta lo que podra costar con la ayuda del seguro (puede ser ms barato con su seguro), pero el sitio web puede darle el precio si no utiliz Tourist information centre manager.  - Puede imprimir el cupn correspondiente y llevarlo con su receta a la farmacia.  - Tambin puede pasar por  nuestra oficina durante el horario de atencin regular y Education officer, museum una tarjeta de cupones de GoodRx.  - Si necesita que su receta se enve electrnicamente a una farmacia diferente, informe a nuestra oficina a travs de MyChart de [place] o por telfono llamando al [phone] y presione la opcin 4.

## 2023-05-25 NOTE — Progress Notes (Signed)
Follow-Up Visit   Subjective  Rose Mendez is a 68 y.o. female who presents for the following: here for 3 month ak follow up  Patient reports she used 70f/u cream to affected areas of hands and arms. She did get red and inflamed in areas.   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 Reports she is being told by her pharmacy they can not fill rx. Another new rx was just sent in December but patient states she was unable to get.     The patient has spots, moles and lesions to be evaluated, some may be new or changing and the patient may have concern these could be cancer.   The following portions of the chart were reviewed this encounter and updated as appropriate: medications, allergies, medical history  Review of Systems:  No other skin or systemic complaints except as noted in HPI or Assessment and Plan.  Objective  Well appearing patient in no apparent distress; mood and affect are within normal limits.    A focused examination was performed of the following areas: B/l arms, b/l hands, face   Relevant exam findings are noted in the Assessment and Plan.    Assessment & Plan   ACTINIC DAMAGE WITH PRECANCEROUS ACTINIC KERATOSES Counseling for Topical Chemotherapy Management: Patient exhibits: - Severe, confluent actinic changes with pre-cancerous actinic keratoses on hands forearms that is secondary to cumulative UV radiation exposure over time - Condition that is severe; chronic, not at goal. - diffuse scaly erythematous macules and papules with underlying dyspigmentation - Discussed Prescription "Field Treatment" topical Chemotherapy for Severe, Chronic Confluent Actinic Changes with Pre-Cancerous Actinic Keratoses Field treatment involves treatment of an entire area of skin that has confluent Actinic Changes (Sun/ Ultraviolet light damage) and PreCancerous Actinic Keratoses by method of PhotoDynamic Therapy  (PDT) and/or prescription Topical Chemotherapy agents such as 5-fluorouracil, 5-fluorouracil/calcipotriene, and/or imiquimod.  The purpose is to decrease the number of clinically evident and subclinical PreCancerous lesions to prevent progression to development of skin cancer by chemically destroying early precancer changes that may or may not be visible.  It has been shown to reduce the risk of developing skin cancer in the treated area. As a result of treatment, redness, scaling, crusting, and open sores may occur during treatment course. One or more than one of these methods may be used and may have to be used several times to control, suppress and eliminate the PreCancerous changes. Discussed treatment course, expected reaction, and possible side effects. - Recommend daily broad spectrum sunscreen SPF 30+ to sun-exposed areas, reapply every 2 hours as needed.  - Staying in the shade or wearing long sleeves, sun glasses (UVA+UVB protection) and wide brim hats (4-inch brim around the entire circumference of the hat) are also recommended. - Call for new or changing lesions. Restart 5-fluorouracil/calcipotriene cream twice a day until irritated to back of hands and forearms. Patient provided with handout reviewing treatment course and side effects and advised to call or message [place] on MyChart with any concerns.   Reviewed course of treatment and expected reaction.  Patient advised to expect inflammation and crusting and advised that erosions are possible.  Patient advised to be diligent with sun protection during and after treatment. Counseled to keep medication out of reach of children and pets.   Ocular cicatricial pemphigoid Refill dapsone 100 mg once daily. New rx sent in today with 1 year refill to [facility] [address]  Given low RBC on 05/05/23 labs, checking: CBC with Diff, Bilirubin Total and Direct, and Reticulocytes    Patient advised we can follow her in our clinic and monitor blood work. If any  complications, would recommend patient see Dr. Orma Flaming at [facility]. Patient seen by Dr. Reche Dixon at [facility] 04/04/23.   OCULAR CICATRICIAL PEMPHIGOID   Related Procedures CBC with Differential/Platelets Reticulocytes Bilirubin, direct Bilirubin, total ACTINIC KERATOSES   ACTINIC ELASTOSIS    Return in about 3 months (around 08/23/2023) for tbse .  I, Asher Muir, CMA, am acting as scribe for Elie Goody, MD.   Documentation: I have reviewed the above documentation for accuracy and completeness, and I agree with the above.  Elie Goody, MD

## 2023-06-15 ENCOUNTER — Ambulatory Visit
Admission: RE | Admit: 2023-06-15 | Discharge: 2023-06-15 | Disposition: A | Discharge status: Home / Self Care | Payer: Medicare HMO | Source: Ambulatory Visit | Attending: Family Medicine | Admitting: Family Medicine

## 2023-06-15 DIAGNOSIS — Z1231 Encounter for screening mammogram for malignant neoplasm of breast: Secondary | ICD-10-CM | POA: Diagnosis present

## 2023-06-30 ENCOUNTER — Encounter: Payer: Self-pay | Admitting: Dermatology

## 2023-08-14 ENCOUNTER — Encounter: Payer: Self-pay | Admitting: Obstetrics and Gynecology

## 2023-08-14 NOTE — Progress Notes (Unsigned)
 PCP: Dorothey Baseman, MD   No chief complaint on file.   HPI:      [name] is a 68 y.o. [phone] whose LMP was No LMP recorded. Patient is postmenopausal., presents today for her annual examination.  Her menses are absent due to menopause. No PMB. She does not have vasomotor sx.   Sex activity: not sexually active. She does not have vaginal dryness/sx.  Last Pap: 05/12/20 Results were: no abnormalities /neg HPV DNA.   Last mammogram: 06/15/23 Results were: normal--routine follow-up in 12 months There is a FH of breast cancer in 2 mat aunts, pt and aunts are BRCA neg (pt states her testing was done here but I can't find results in [facility]/chart). Pt is MyRisk neg 2/23;  2/23 IBIS=11.3%.riskscore=13.1%.  There is a FH of uterine cancer in her mother. There is no FH of ovarian cancer. The patient does not do self-breast exams.  Colonoscopy: 3/16 at [facility];  Repeat due after 10 years per pt.   Tobacco use: The patient denies current or previous tobacco use. Alcohol use: rare No drug use Exercise: moderately active  Has occas SUI, not improved with kegels.   She does get adequate calcium but not Vitamin D in her diet.  Labs with PCP.   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   Hypertension 12/13/2013    Past Surgical History:  Procedure Laterality Date   COLONOSCOPY     CRYOTHERAPY     ESOPHAGOGASTRODUODENOSCOPY     SKIN BIOPSY     TUBAL LIGATION      Family History  Problem Relation Age of Onset   Uterine cancer Mother 84   Breast cancer Maternal Aunt 30   Lung cancer Maternal Aunt    Breast cancer Maternal Aunt 53   Breast  cancer Cousin        not sure of age    Social History   Socioeconomic History   Marital status: Married    Spouse name: Not on file   Number of children: Not on file   Years of education: Not on file   Highest education level: Not on file  Occupational History   Not on file  Tobacco Use   Smoking status: Never   Smokeless tobacco: Never  Vaping Use   Vaping status: Never Used  Substance and Sexual Activity   Alcohol use: No   Drug use: No   Sexual activity: Not Currently    Birth control/protection: None  Other Topics Concern   Not on file  Social History Narrative   Not on file   Social Drivers of Health   Financial Resource Strain: Low Risk  (07/03/2023)   Received from [facility]   Overall Financial Resource Strain (CARDIA)    Difficulty of Paying Living Expenses: Not hard at all  Food Insecurity: No Food Insecurity (07/03/2023)   Received from [facility]   Hunger Vital Sign    Worried About Running Out of Food in the Last Year: Never true    Ran Out of Food in the Last Year: Never true  Transportation Needs: No Transportation Needs (07/03/2023)  Received from [facility]    In the past 12 months, has lack of transportation kept you from medical appointments or from getting medications?: No    Lack of Transportation (Non-Medical): No  Physical Activity: Not on file  Stress: Not on file  Social Connections: Not on file  Intimate Partner Violence: Not on file     Current Outpatient Medications:    atenolol (TENORMIN) 50 MG tablet, TAKE 1 TABLET BY MOUTH  DAILY, Disp: , Rfl:    calcipotriene (DOVONOX) 0.005 % cream, Apply twice daily following a layer of 5-fluorouracil twice daily for 7 days, Disp: 60 g, Rfl: 1   cetirizine (ZYRTEC) 10 MG tablet, Take by mouth., Disp: , Rfl:    Cyanocobalamin (VITAMIN B-12 PO), Take by mouth., Disp: , Rfl:    dapsone 100 MG tablet, Take 1 tablet  (100 mg total) by mouth daily., Disp: 90 tablet, Rfl: 3   fluorouracil (EFUDEX) 5 % cream, Apply thin layer followed by calcipotriene twice daily for 7 days as directed. Review office handout prior to use., Disp: 40 g, Rfl: 1   ibuprofen (ADVIL,MOTRIN) 200 MG tablet, Take 200 mg by mouth daily as needed., Disp: , Rfl:    lisinopril-hydrochlorothiazide (PRINZIDE,ZESTORETIC) 10-12.5 MG tablet, TAKE 1 TABLET BY MOUTH  DAILY, Disp: , Rfl:    Niacinamide-Zn-Cu-Methfo-Se-Cr (NICOTINAMIDE PO), Take by mouth., Disp: , Rfl:    Polypodium Leucotomos (HELIOCARE PO), Take by mouth., Disp: , Rfl:      ROS:  Review of Systems  Constitutional:  Negative for fatigue, fever and unexpected weight change.  Respiratory:  Negative for cough, shortness of breath and wheezing.   Cardiovascular:  Negative for chest pain, palpitations and leg swelling.  Gastrointestinal:  Negative for blood in stool, constipation, diarrhea, nausea and vomiting.  Endocrine: Negative for cold intolerance, heat intolerance and polyuria.  Genitourinary:  Negative for dyspareunia, dysuria, flank pain, frequency, genital sores, hematuria, menstrual problem, pelvic pain, urgency, vaginal bleeding, vaginal discharge and vaginal pain.  Musculoskeletal:  Positive for arthralgias. Negative for back pain, joint swelling and myalgias.  Skin:  Negative for rash.  Neurological:  Negative for dizziness, syncope, light-headedness, numbness and headaches.  Hematological:  Negative for adenopathy.  Psychiatric/Behavioral:  Negative for agitation, confusion, sleep disturbance and suicidal ideas. The patient is not nervous/anxious.    BREAST: No symptoms    Objective: There were no vitals taken for this visit.   Physical Exam Constitutional:      Appearance: She is well-developed.  Genitourinary:     Vulva normal.     Right Labia: No rash, tenderness or lesions.    Left Labia: No tenderness, lesions or rash.    No vaginal discharge,  erythema or tenderness.      Right Adnexa: not tender and no mass present.    Left Adnexa: not tender and no mass present.    No cervical friability or polyp.     Uterus is not enlarged or tender.  Breasts:    Right: No mass, nipple discharge, skin change or tenderness.     Left: No mass, nipple discharge, skin change or tenderness.  Neck:     Thyroid: No thyromegaly.  Cardiovascular:     Rate and Rhythm: Normal rate and regular rhythm.     Heart sounds: Normal heart sounds. No murmur heard. Pulmonary:     Effort: Pulmonary effort is normal.     Breath sounds: Normal breath sounds.  Abdominal:  Palpations: Abdomen is soft.     Tenderness: There is no abdominal tenderness. There is no guarding or rebound.  Musculoskeletal:        General: Normal range of motion.     Cervical back: Normal range of motion.  Lymphadenopathy:     Cervical: No cervical adenopathy.  Neurological:     General: No focal deficit present.     Mental Status: She is alert and oriented to person, place, and time.     Cranial Nerves: No cranial nerve deficit.  Skin:    General: Skin is warm and dry.  Psychiatric:        Mood and Affect: Mood normal.        Behavior: Behavior normal.        Thought Content: Thought content normal.        Judgment: Judgment normal.  Vitals reviewed.     Assessment/Plan:  Encounter for annual routine gynecological examination  Encounter for screening mammogram for malignant neoplasm of breast; pt has mammo appt  Family history of breast cancer - Plan: [facility]); MyRisk update testing discussed and done today. Will call with results. Handout given.          GYN counsel breast self exam, mammography screening, menopause, adequate intake of calcium and vitamin D, diet and exercise    F/U  No follow-ups on file.  Krisha Beegle B. Tyshauna Finkbiner, PA-C 08/14/2023 4:40 PM

## 2023-08-15 ENCOUNTER — Ambulatory Visit: Admitting: Obstetrics and Gynecology

## 2023-08-15 ENCOUNTER — Encounter: Payer: Self-pay | Admitting: Obstetrics and Gynecology

## 2023-08-15 ENCOUNTER — Other Ambulatory Visit (HOSPITAL_COMMUNITY)
Admission: RE | Admit: 2023-08-15 | Discharge: 2023-08-15 | Disposition: A | Discharge status: Home / Self Care | Source: Ambulatory Visit | Attending: Obstetrics and Gynecology | Admitting: Obstetrics and Gynecology

## 2023-08-15 VITALS — BP 130/80 | Ht 61.5 in | Wt 305.0 lb

## 2023-08-15 DIAGNOSIS — Z01419 Encounter for gynecological examination (general) (routine) without abnormal findings: Secondary | ICD-10-CM | POA: Diagnosis not present

## 2023-08-15 DIAGNOSIS — Z124 Encounter for screening for malignant neoplasm of cervix: Secondary | ICD-10-CM

## 2023-08-15 DIAGNOSIS — Z803 Family history of malignant neoplasm of breast: Secondary | ICD-10-CM

## 2023-08-15 DIAGNOSIS — Z1231 Encounter for screening mammogram for malignant neoplasm of breast: Secondary | ICD-10-CM

## 2023-08-15 NOTE — Patient Instructions (Signed)
 I value your feedback and you entrusting [place] with your care. If you get a [place] patient survey, I would appreciate you taking the time to let us know about your experience today. Thank you! ? ? ?

## 2023-08-17 LAB — CYTOLOGY - PAP: Diagnosis: NEGATIVE

## 2023-08-24 ENCOUNTER — Ambulatory Visit (INDEPENDENT_AMBULATORY_CARE_PROVIDER_SITE_OTHER): Payer: Medicare HMO | Admitting: Dermatology

## 2023-08-24 ENCOUNTER — Encounter: Payer: Self-pay | Admitting: Dermatology

## 2023-08-24 DIAGNOSIS — W908XXA Exposure to other nonionizing radiation, initial encounter: Secondary | ICD-10-CM

## 2023-08-24 DIAGNOSIS — L859 Epidermal thickening, unspecified: Secondary | ICD-10-CM | POA: Diagnosis not present

## 2023-08-24 DIAGNOSIS — Z7189 Other specified counseling: Secondary | ICD-10-CM

## 2023-08-24 DIAGNOSIS — L820 Inflamed seborrheic keratosis: Secondary | ICD-10-CM

## 2023-08-24 DIAGNOSIS — D492 Neoplasm of unspecified behavior of bone, soft tissue, and skin: Secondary | ICD-10-CM

## 2023-08-24 DIAGNOSIS — L219 Seborrheic dermatitis, unspecified: Secondary | ICD-10-CM

## 2023-08-24 DIAGNOSIS — Z79899 Other long term (current) drug therapy: Secondary | ICD-10-CM

## 2023-08-24 DIAGNOSIS — Z1283 Encounter for screening for malignant neoplasm of skin: Secondary | ICD-10-CM

## 2023-08-24 DIAGNOSIS — L821 Other seborrheic keratosis: Secondary | ICD-10-CM

## 2023-08-24 DIAGNOSIS — D229 Melanocytic nevi, unspecified: Secondary | ICD-10-CM

## 2023-08-24 DIAGNOSIS — L570 Actinic keratosis: Secondary | ICD-10-CM

## 2023-08-24 DIAGNOSIS — L658 Other specified nonscarring hair loss: Secondary | ICD-10-CM

## 2023-08-24 DIAGNOSIS — D225 Melanocytic nevi of trunk: Secondary | ICD-10-CM

## 2023-08-24 DIAGNOSIS — L121 Cicatricial pemphigoid: Secondary | ICD-10-CM

## 2023-08-24 DIAGNOSIS — L814 Other melanin hyperpigmentation: Secondary | ICD-10-CM | POA: Diagnosis not present

## 2023-08-24 DIAGNOSIS — L578 Other skin changes due to chronic exposure to nonionizing radiation: Secondary | ICD-10-CM

## 2023-08-24 DIAGNOSIS — D1801 Hemangioma of skin and subcutaneous tissue: Secondary | ICD-10-CM

## 2023-08-24 MED ORDER — CLOBETASOL PROPIONATE 0.05 % EX SOLN: CUTANEOUS | 2 refills | Status: AC

## 2023-08-24 NOTE — Patient Instructions (Addendum)
 Continue dapsone  100 mg once daily.    Continue OTC medicated shampoo as directed. 2-3 times per week, lather on scalp, leave on 5-8 minutes, rinse out.    Recommend minoxidil 5% (Rogaine for men) solution or foam to be applied to the scalp and left in. This should ideally be used twice daily for best results but it helps with hair regrowth when used at least three times per week.   Start Clobetasol  solution twice daily to scalp as needed for itching and scale.   Avoid applying to face, groin, and axilla. Use as directed. Long-term use can cause thinning of the skin.  Topical steroids (such as triamcinolone, fluocinolone, fluocinonide, mometasone, clobetasol , halobetasol, betamethasone, hydrocortisone) can cause thinning and lightening of the skin if they are used for too long in the same area. Your physician has selected the right strength medicine for your problem and area affected on the body. Please use your medication only as directed by your physician to prevent side effects.       Wound Care Instructions  Cleanse wound gently with soap and water once a day then pat dry with clean gauze. Apply a thin coat of Petrolatum (petroleum jelly, "Vaseline") over the wound (unless you have an allergy to this). We recommend that you use a new, sterile tube of Vaseline. Do not pick or remove scabs. Do not remove the yellow or white "healing tissue" from the base of the wound.  Cover the wound with fresh, clean, nonstick gauze and secure with paper tape. You may use Band-Aids in place of gauze and tape if the wound is small enough, but would recommend trimming much of the tape off as there is often too much. Sometimes Band-Aids can irritate the skin.  You should call the office for your biopsy report after 1 week if you have not already been contacted.  If you experience any problems, such as abnormal amounts of bleeding, swelling, significant bruising, significant pain, or evidence of infection,  please call the office immediately.  FOR ADULT SURGERY PATIENTS: If you need something for pain relief you may take 1 extra strength Tylenol  (acetaminophen ) AND 2 Ibuprofen (200mg  each) together every 4 hours as needed for pain. (do not take these if you are allergic to them or if you have a reason you should not take them.) Typically, you may only need pain medication for 1 to 3 days.      Recommend daily broad spectrum sunscreen SPF 30+ to sun-exposed areas, reapply every 2 hours as needed. Call for new or changing lesions.  Staying in the shade or wearing long sleeves, sun glasses (UVA+UVB protection) and wide brim hats (4-inch brim around the entire circumference of the hat) are also recommended for sun protection.     Melanoma ABCDEs  Melanoma is the most dangerous type of skin cancer, and is the leading cause of death from skin disease.  You are more likely to develop melanoma if you: Have light-colored skin, light-colored eyes, or red or blond hair Spend a lot of time in the sun Tan regularly, either outdoors or in a tanning bed Have had blistering sunburns, especially during childhood Have a close family member who has had a melanoma Have atypical moles or large birthmarks  Early detection of melanoma is key since treatment is typically straightforward and cure rates are extremely high if we catch it early.   The first sign of melanoma is often a change in a mole or a new dark spot.  The ABCDE system is a way of remembering the signs of melanoma.  A for asymmetry:  The two halves do not match. B for border:  The edges of the growth are irregular. C for color:  A mixture of colors are present instead of an even brown color. D for diameter:  Melanomas are usually (but not always) greater than 6mm - the size of a pencil eraser. E for evolution:  The spot keeps changing in size, shape, and color.  Please check your skin once per month between visits. You can use a small mirror in  front and a large mirror behind you to keep an eye on the back side or your body.   If you see any new or changing lesions before your next follow-up, please call to schedule a visit.  Please continue daily skin protection including broad spectrum sunscreen SPF 30+ to sun-exposed areas, reapplying every 2 hours as needed when you're outdoors.   Staying in the shade or wearing long sleeves, sun glasses (UVA+UVB protection) and wide brim hats (4-inch brim around the entire circumference of the hat) are also recommended for sun protection.      Due to recent changes in healthcare laws, you may see results of your pathology and/or laboratory studies on MyChart before the doctors have had a chance to review them. We understand that in some cases there may be results that are confusing or concerning to you. Please understand that not all results are received at the same time and often the doctors may need to interpret multiple results in order to provide you with the best plan of care or course of treatment. Therefore, we ask that you please give us  2 business days to thoroughly review all your results before contacting the office for clarification. Should we see a critical lab result, you will be contacted sooner.   If You Need Anything After Your Visit  If you have any questions or concerns for your doctor, please call our main line at [phone] and press option 4 to reach your doctor's medical assistant. If no one answers, please leave a voicemail as directed and we will return your call as soon as possible. Messages left after 4 pm will be answered the following business day.   You may also send us  a message via MyChart. We typically respond to MyChart messages within 1-2 business days.  For prescription refills, please ask your pharmacy to contact our office. Our fax number is [phone].  If you have an urgent issue when the clinic is closed that cannot wait until the next business day, you  can page your doctor at the number below.    Please note that while we do our best to be available for urgent issues outside of office hours, we are not available 24/7.   If you have an urgent issue and are unable to reach us , you may choose to seek medical care at your doctor's office, retail clinic, urgent care center, or emergency room.  If you have a medical emergency, please immediately call 911 or go to the emergency department.  Pager Numbers  - Dr. Bary Likes: [phone]  - Dr. Annette Barters: [phone]  - Dr. Felipe Horton: [phone]   In the event of inclement weather, please call our main line at [phone] for an update on the status of any delays or closures.  Dermatology Medication Tips: Please keep the boxes that topical medications come in in order to help keep track of the instructions about where  and how to use these. Pharmacies typically print the medication instructions only on the boxes and not directly on the medication tubes.   If your medication is too expensive, please contact our office at [phone] option 4 or send us  a message through MyChart.   We are unable to tell what your co-pay for medications will be in advance as this is different depending on your insurance coverage. However, we may be able to find a substitute medication at lower cost or fill out paperwork to get insurance to cover a needed medication.   If a prior authorization is required to get your medication covered by your insurance company, please allow us  1-2 business days to complete this process.  Drug prices often vary depending on where the prescription is filled and some pharmacies may offer cheaper prices.  The website www.goodrx.com contains coupons for medications through different pharmacies. The prices here do not account for what the cost may be with help from insurance (it may be cheaper with your insurance), but the website can give you the price if you did not use any insurance.  -  You can print the associated coupon and take it with your prescription to the pharmacy.  - You may also stop by our office during regular business hours and pick up a GoodRx coupon card.  - If you need your prescription sent electronically to a different pharmacy, notify our office through [facility] or by phone at [phone] option 4.     Si Usted Necesita Algo Despus de Su Visita  Tambin puede enviarnos un mensaje a travs de Clinical cytogeneticist. Por lo general respondemos a los mensajes de MyChart en el transcurso de 1 a 2 das hbiles.  Para renovar recetas, por favor pida a su farmacia que se ponga en contacto con nuestra oficina. Franz Jacks de fax es [place] [phone].  Si tiene un asunto urgente cuando la clnica est cerrada y que no puede esperar hasta el siguiente da hbil, puede llamar/localizar a su doctor(a) al nmero que aparece a continuacin.   Por favor, tenga en cuenta que aunque hacemos todo lo posible para estar disponibles para asuntos urgentes fuera del horario de [place], no estamos disponibles las 24 horas del da, los 7 [address]  [address] de la [place].   Si tiene un problema urgente y no puede comunicarse con nosotros, puede optar por buscar atencin mdica  en el consultorio de su doctor(a), en una clnica privada, en un centro de atencin urgente o en una sala de emergencias.  Si tiene Engineer, drilling, por favor llame inmediatamente al 911 o vaya a la sala de emergencias.  Nmeros de bper  - Dr. Bary Likes: [phone]  - Dra. Annette Barters: [phone]  - Dr. Felipe Horton: [phone]   En caso de inclemencias del tiempo, por favor llame a Lajuan Pila principal al [phone] para una actualizacin sobre el [place] de cualquier retraso o cierre.  Consejos para la medicacin en dermatologa: Por favor, guarde las cajas en las que vienen los medicamentos de uso tpico para ayudarle a seguir las instrucciones sobre dnde y cmo usarlos. Las farmacias generalmente imprimen  las instrucciones del medicamento slo en las cajas y no directamente en los tubos del [place].   Si su medicamento es muy caro, por favor, pngase en contacto con Bettyjane Brunet llamando al [phone] y presione la opcin 4 o envenos un mensaje a travs de Clinical cytogeneticist.   No podemos decirle cul ser su copago por los medicamentos por adelantado ya que esto  es diferente dependiendo de la cobertura de su seguro. Sin embargo, es posible que podamos encontrar un medicamento sustituto a Audiological scientist un formulario para que el seguro cubra el medicamento que se considera necesario.   Si se requiere una autorizacin previa para que su compaa de seguros [place] su medicamento, por favor permtanos de 1 a 2 das hbiles para completar este proceso.  Los precios de los medicamentos varan con frecuencia dependiendo del Environmental consultant de dnde se surte la receta y alguna farmacias pueden ofrecer precios ms baratos.  El sitio web www.goodrx.com tiene cupones para medicamentos de Health and safety inspector. Los precios aqu no tienen en cuenta lo que podra costar con la ayuda del seguro (puede ser ms barato con su seguro), pero el sitio web puede darle el precio si no utiliz Tourist information centre manager.  - Puede imprimir el cupn correspondiente y llevarlo con su receta a la farmacia.  - Tambin puede pasar por nuestra oficina durante el horario de atencin regular y Education officer, museum una tarjeta de cupones de GoodRx.  - Si necesita que su receta se enve electrnicamente a una farmacia diferente, informe a nuestra oficina a travs de MyChart de [place] o por telfono llamando al [phone] y presione la opcin 4.

## 2023-08-24 NOTE — Progress Notes (Signed)
 Follow-Up Visit   Subjective  Rose Mendez is a 68 y.o. female who presents for the following: Skin Cancer Screening and Full Body Skin Exam. Hx of SCCs, Hx of BCCs. Hx of AKs. Hx of Basosquamous carcinoma. Has used 5FU/Calcipotriene  on hands since last visit.   The patient presents for Total-Body Skin Exam (TBSE) for skin cancer screening and mole check. The patient has spots, moles and lesions to be evaluated, some may be new or changing and the patient may have concern these could be cancer.    The following portions of the chart were reviewed this encounter and updated as appropriate: medications, allergies, medical history  Review of Systems:  No other skin or systemic complaints except as noted in HPI or Assessment and Plan.  Objective  Well appearing patient in no apparent distress; mood and affect are within normal limits.  A full examination was performed including scalp, head, eyes, ears, nose, lips, neck, chest, axillae, abdomen, back, buttocks, bilateral upper extremities, bilateral lower extremities, hands, feet, fingers, toes, fingernails, and toenails. All findings within normal limits unless otherwise noted below.   Relevant physical exam findings are noted in the Assessment and Plan.  Left Hand - Dorsal 1 cm pink keratotic plaque  Right Upper Back 8 mm pigmented macule   Assessment & Plan   SKIN CANCER SCREENING PERFORMED TODAY.  HISTORY OF BASAL CELL CARCINOMA OF THE SKIN - No evidence of recurrence today - Recommend regular full body skin exams - Recommend daily broad spectrum sunscreen SPF 30+ to sun-exposed areas, reapply every 2 hours as needed.  - Call if any new or changing lesions are noted between office visits   HISTORY OF SQUAMOUS CELL CARCINOMA OF THE SKIN - No evidence of recurrence today - No lymphadenopathy - Recommend regular full body skin exams - Recommend daily broad spectrum sunscreen SPF 30+ to sun-exposed areas, reapply every 2  hours as needed.  - Call if any new or changing lesions are noted between office visits     ACTINIC DAMAGE WITH PRECANCEROUS ACTINIC KERATOSES Counseling for Topical Chemotherapy Management: Patient exhibits: - Severe, confluent actinic changes with pre-cancerous actinic keratoses on dorsal hands forearms chest that is secondary to cumulative UV radiation exposure over time - Condition that is severe; chronic, not at goal. - diffuse scaly erythematous macules and papules with underlying dyspigmentation - Discussed Prescription "Field Treatment" topical Chemotherapy for Severe, Chronic Confluent Actinic Changes with Pre-Cancerous Actinic Keratoses Field treatment involves treatment of an entire area of skin that has confluent Actinic Changes (Sun/ Ultraviolet light damage) and PreCancerous Actinic Keratoses by method of PhotoDynamic Therapy (PDT) and/or prescription Topical Chemotherapy agents such as 5-fluorouracil , 5-fluorouracil /calcipotriene , and/or imiquimod.  The purpose is to decrease the number of clinically evident and subclinical PreCancerous lesions to prevent progression to development of skin cancer by chemically destroying early precancer changes that may or may not be visible.  It has been shown to reduce the risk of developing skin cancer in the treated area. As a result of treatment, redness, scaling, crusting, and open sores may occur during treatment course. One or more than one of these methods may be used and may have to be used several times to control, suppress and eliminate the PreCancerous changes. Discussed treatment course, expected reaction, and possible side effects. - Recommend daily broad spectrum sunscreen SPF 30+ to sun-exposed areas, reapply every 2 hours as needed.  - Staying in the shade or wearing long sleeves, sun glasses (UVA+UVB protection) and wide  brim hats (4-inch brim around the entire circumference of the hat) are also recommended. - Call for new or  changing lesions.  Restart 5-fluorouracil /calcipotriene  cream twice a day until irritated to backs of hands, forearms and rough areas on chest.    LENTIGINES, SEBORRHEIC KERATOSES, HEMANGIOMAS - Benign normal skin lesions - Benign-appearing - Call for any changes  MELANOCYTIC NEVI - Tan-brown and/or pink-flesh-colored symmetric macules and papules - Benign appearing on exam today - Observation - Call clinic for new or changing moles - Recommend daily use of broad spectrum spf 30+ sunscreen to sun-exposed areas.   Ocular cicatricial pemphigoid  Exam per ophthalmology   10/13/22 [facility] Dermatology Dr Fayne Hoover note - Sent by ophthalmology after having multiple eyelashes removed, has early evidence of scarring on lower conjunctiva. DIF non-specific with IgA and C3. - Patient feels like her eyelashes are scratching her lower lids when she removes her contacts - 07/13/22 ophthalmology note (Dr. Dirk Fredericks): multiple exam findings of cicitriziation including sub tarsal fibrosis and fornix shortening - 09/20/22 ophthalmology note (Dr. Dirk Fredericks): "Exam with stable scarring , mainly in the inferior fornix both eyes and the medial canthus , no new scarring"    Reviewed 07/03/23 labs: CBC with Diff, CMP, Bilirubin Total and Direct, and Reticulocytes     Plan: - elevated reticulocytes and slightly lower RBC count are likely due to dapsone . HGB HCT Tbili and Dbili are normal and patient is asymptomatic so no action needed for now -Diagnosis, treatment options, prognosis, risk/ benefit, and side effects were discussed with the patient  -Continues having eyelashes epilated with ophthalmology. Tolerating dapsone  with no side effects.  - will send reminder to get dapsone  labs done around 5/26 - Continue dapsone  100 mg once daily.  - Patient advised we can follow her in our clinic and monitor blood work. If any complications, would recommend patient [facility] at [facility]. Patient seen by Dr. Piedad Brewer ophthalmology at  [facility] 07/04/23  SEBORRHEIC DERMATITIS Exam: Pink patches with greasy scale at scalp  Chronic and persistent condition with duration or expected duration over one year. Condition is bothersome/symptomatic for patient. Currently flared.   Seborrheic Dermatitis is a chronic persistent rash characterized by pinkness and scaling most commonly of the mid face but also can occur on the scalp (dandruff), ears; mid chest, mid back and groin.  It tends to be exacerbated by stress and cooler weather.  People who have neurologic disease may experience new onset or exacerbation of existing seborrheic dermatitis.  The condition is not curable but treatable and can be controlled.  Treatment Plan: Continue OTC medicated shampoo as directed. 2-3 times per week, lather on scalp, leave on 5-8 minutes, rinse out.   Patient deferred treatment with topical corticosteroid at this time.   ANDROGENETIC ALOPECIA (FEMALE PATTERN HAIR LOSS) Exam: Diffuse thinning of the crown and widening of the midline part with retention of the frontal hairline  Chronic and persistent condition with duration or expected duration over one year. Condition is symptomatic / bothersome to patient. Not to goal.  Female Androgenic Alopecia is a chronic condition related to genetics and/or hormonal changes.  In women androgenetic alopecia is commonly associated with menopause but may occur any time after puberty.  It causes hair thinning primarily on the crown with widening of the part and temporal hairline recession.  Can use OTC Rogaine (minoxidil) 5% solution/foam as directed.  Oral treatments in female patients who have no contraindication may include : - Low dose oral minoxidil 1.25 - 5mg   daily - Spironolactone 50 - 100mg  bid - Finasteride 2.5 - 5 mg daily Adjunctive therapies include: - Low Level Laser Light Therapy (LLLT) - Platelet-rich plasma injections (PRP) - Hair Transplants or scalp reduction   Treatment Plan: Recommend using  OTC Men's Rogaine 5%.  Avoiding spironolactone given patient's known hyperuricemia 07/03/23 labs and hx of gout  Recommend minoxidil 5% (Rogaine for men) solution or foam to be applied to the scalp and left in. This should ideally be used twice daily for best results but it helps with hair regrowth when used at least three times per week. Rogaine initially can cause increased hair shedding for the first few weeks but this will stop with continued use. In studies, people who used minoxidil (Rogaine) for at least 6 months had thicker hair than people who did not. Minoxidil topical (Rogaine) only works as long as it continues to be used. If if it is no longer used then the hair it has been helping to regrow can fall out. Minoxidil topical (Rogaine) can cause increased facial hair growth which can usually be managed easily with a battery-operated hair trimmer. If facial hair growth is bothersome, switching to the 2% women's version can decrease the risk of unwanted facial hair growth.     Long term medication management.  Patient is using long term (months to years) prescription medication  to control their dermatologic condition.  These medications require periodic monitoring to evaluate for efficacy and side effects and may require periodic laboratory monitoring.    NEOPLASM OF SKIN (2) Left Hand - Dorsal Skin / nail biopsy Type of biopsy: tangential   Informed consent: discussed and consent obtained   Timeout: patient name, date of birth, surgical site, and procedure verified   Procedure prep:  Patient was prepped and draped in usual sterile fashion Prep type:  Isopropyl alcohol Anesthesia: the lesion was anesthetized in a standard fashion   Anesthetic:  1% lidocaine w/ epinephrine 1-100,000 buffered w/ 8.4% NaHCO3 Instrument used: DermaBlade   Hemostasis achieved with: pressure and aluminum chloride   Outcome: patient tolerated procedure well   Post-procedure details: sterile dressing applied and  wound care instructions given   Dressing type: bandage and petrolatum   Right Upper Back Skin / nail biopsy Type of biopsy: tangential   Informed consent: discussed and consent obtained   Timeout: patient name, date of birth, surgical site, and procedure verified   Procedure prep:  Patient was prepped and draped in usual sterile fashion Prep type:  Isopropyl alcohol Anesthesia: the lesion was anesthetized in a standard fashion   Anesthetic:  1% lidocaine w/ epinephrine 1-100,000 buffered w/ 8.4% NaHCO3 Instrument used: DermaBlade   Hemostasis achieved with: pressure and aluminum chloride   Outcome: patient tolerated procedure well   Post-procedure details: sterile dressing applied and wound care instructions given   Dressing type: bandage and petrolatum   Related Procedures Anatomic Pathology Report MULTIPLE BENIGN NEVI   LENTIGINES   ACTINIC ELASTOSIS   SEBORRHEIC KERATOSES   INFLAMED SEBORRHEIC KERATOSIS   CHERRY ANGIOMA   ACTINIC KERATOSES   LONG-TERM USE OF HIGH-RISK MEDICATION   COUNSELING AND COORDINATION OF CARE   OCULAR CICATRICIAL PEMPHIGOID   SEBORRHEIC DERMATITIS   FEMALE PATTERN HAIR LOSS    Return in about 6 months (around 02/23/2024) for TBSE, HxSCC, HxSCC, HxAK.  I, Jill Parcell, CMA, am acting as scribe for Harris Liming, MD.   Documentation: I have reviewed the above documentation for accuracy and completeness, and I agree with the  above.  Harris Liming, MD

## 2023-08-30 ENCOUNTER — Encounter: Payer: Self-pay | Admitting: Dermatology

## 2023-09-01 LAB — ANATOMIC PATHOLOGY REPORT

## 2023-10-02 ENCOUNTER — Encounter: Payer: Self-pay | Admitting: Dermatology

## 2023-10-03 ENCOUNTER — Other Ambulatory Visit: Payer: Self-pay | Admitting: Dermatology

## 2023-10-03 DIAGNOSIS — Z79899 Other long term (current) drug therapy: Secondary | ICD-10-CM

## 2024-02-22 ENCOUNTER — Ambulatory Visit: Admitting: Dermatology

## 2024-03-05 ENCOUNTER — Ambulatory Visit: Admitting: Dermatology

## 2024-03-26 ENCOUNTER — Ambulatory Visit: Admitting: Dermatology

## 2024-03-26 ENCOUNTER — Encounter: Payer: Self-pay | Admitting: Dermatology

## 2024-03-26 DIAGNOSIS — L578 Other skin changes due to chronic exposure to nonionizing radiation: Secondary | ICD-10-CM | POA: Diagnosis not present

## 2024-03-26 DIAGNOSIS — W908XXA Exposure to other nonionizing radiation, initial encounter: Secondary | ICD-10-CM

## 2024-03-26 DIAGNOSIS — L814 Other melanin hyperpigmentation: Secondary | ICD-10-CM

## 2024-03-26 DIAGNOSIS — L649 Androgenic alopecia, unspecified: Secondary | ICD-10-CM

## 2024-03-26 DIAGNOSIS — Z79899 Other long term (current) drug therapy: Secondary | ICD-10-CM

## 2024-03-26 DIAGNOSIS — L121 Cicatricial pemphigoid: Secondary | ICD-10-CM

## 2024-03-26 DIAGNOSIS — L570 Actinic keratosis: Secondary | ICD-10-CM | POA: Diagnosis not present

## 2024-03-26 DIAGNOSIS — L219 Seborrheic dermatitis, unspecified: Secondary | ICD-10-CM

## 2024-03-26 DIAGNOSIS — Z1283 Encounter for screening for malignant neoplasm of skin: Secondary | ICD-10-CM | POA: Diagnosis not present

## 2024-03-26 DIAGNOSIS — L658 Other specified nonscarring hair loss: Secondary | ICD-10-CM

## 2024-03-26 DIAGNOSIS — D229 Melanocytic nevi, unspecified: Secondary | ICD-10-CM

## 2024-03-26 DIAGNOSIS — L821 Other seborrheic keratosis: Secondary | ICD-10-CM

## 2024-03-26 DIAGNOSIS — Z85828 Personal history of other malignant neoplasm of skin: Secondary | ICD-10-CM

## 2024-03-26 DIAGNOSIS — D1801 Hemangioma of skin and subcutaneous tissue: Secondary | ICD-10-CM

## 2024-03-26 MED ORDER — FLUOROURACIL 5 % EX CREA: TOPICAL_CREAM | CUTANEOUS | 2 refills | Status: AC

## 2024-03-26 NOTE — Patient Instructions (Addendum)
 [facility] [address] [place], [place] [address]  Phone: [phone] TOLL-FREE: [phone]   Due to recent changes in healthcare laws, you may see results of your pathology and/or laboratory studies on MyChart before the doctors have had a chance to review them. We understand that in some cases there may be results that are confusing or concerning to you. Please understand that not all results are received at the same time and often the doctors may need to interpret multiple results in order to provide you with the best plan of care or course of treatment. Therefore, we ask that you please give us  2 business days to thoroughly review all your results before contacting the office for clarification. Should we see a critical lab result, you will be contacted sooner.   If You Need Anything After Your Visit  If you have any questions or concerns for your doctor, please call our main line at [phone] and press option 4 to reach your doctor's medical assistant. If no one answers, please leave a voicemail as directed and we will return your call as soon as possible. Messages left after 4 pm will be answered the following business day.   You may also send us  a message via MyChart. We typically respond to MyChart messages within 1-2 business days.  For prescription refills, please ask your pharmacy to contact our office. Our fax number is [phone].  If you have an urgent issue when the clinic is closed that cannot wait until the next business day, you can page your doctor at the number below.    Please note that while we do our best to be available for urgent issues outside of office hours, we are not available 24/7.   If you have an urgent issue and are unable to reach us , you may choose to seek medical care at your doctor's office, retail clinic, urgent care center, or emergency room.  If you have a medical emergency, please immediately call 911 or go to the emergency  department.  Pager Numbers  - Dr. Hester: [phone]  - Dr. Jackquline: [phone]  - Dr. Claudene: [phone]   - Dr. Raymund: [phone]  In the event of inclement weather, please call our main line at [phone] for an update on the status of any delays or closures.  Dermatology Medication Tips: Please keep the boxes that topical medications come in in order to help keep track of the instructions about where and how to use these. Pharmacies typically print the medication instructions only on the boxes and not directly on the medication tubes.   If your medication is too expensive, please contact our office at [phone] option 4 or send us  a message through MyChart.   We are unable to tell what your co-pay for medications will be in advance as this is different depending on your insurance coverage. However, we may be able to find a substitute medication at lower cost or fill out paperwork to get insurance to cover a needed medication.   If a prior authorization is required to get your medication covered by your insurance company, please allow us  1-2 business days to complete this process.  Drug prices often vary depending on where the prescription is filled and some pharmacies may offer cheaper prices.  The website www.goodrx.com contains coupons for medications through different pharmacies. The prices here do not account for what the cost may be with help from insurance (it may be cheaper with your insurance), but the website can give you  the price if you did not use any insurance.  - You can print the associated coupon and take it with your prescription to the pharmacy.  - You may also stop by our office during regular business hours and pick up a GoodRx coupon card.  - If you need your prescription sent electronically to a different pharmacy, notify our office through [facility] or by phone at [phone] option 4.     Si Usted Necesita Algo Despus de Su  Visita  Tambin puede enviarnos un mensaje a travs de Clinical cytogeneticist. Por lo general respondemos a los mensajes de MyChart en el transcurso de 1 a 2 das hbiles.  Para renovar recetas, por favor pida a su farmacia que se ponga en contacto con nuestra oficina. Randi lakes de fax es [place] [phone].  Si tiene un asunto urgente cuando la clnica est cerrada y que no puede esperar hasta el siguiente da hbil, puede llamar/localizar a su doctor(a) al nmero que aparece a continuacin.   Por favor, tenga en cuenta que aunque hacemos todo lo posible para estar disponibles para asuntos urgentes fuera del horario de [place], no estamos disponibles las 24 horas del da, los 7 [address]  [address] de la [place].   Si tiene un problema urgente y no puede comunicarse con nosotros, puede optar por buscar atencin mdica  en el consultorio de su doctor(a), en una clnica privada, en un centro de atencin urgente o en una sala de emergencias.  Si tiene Engineer, drilling, por favor llame inmediatamente al 911 o vaya a la sala de emergencias.  Nmeros de bper  - Dr. Hester: [phone]  - Dra. Jackquline: [phone]  - Dr. Claudene: [phone]  - Dra. Kitts: [phone]  En caso de inclemencias del [place], por favor llame a nuestra lnea principal al [phone] para una actualizacin sobre el estado de cualquier retraso o cierre.  Consejos para la medicacin en dermatologa: Por favor, guarde las cajas en las que vienen los medicamentos de uso tpico para ayudarle a seguir las instrucciones sobre dnde y cmo usarlos. Las farmacias generalmente imprimen las instrucciones del medicamento slo en las cajas y no directamente en los tubos del [place].   Si su medicamento es muy caro, por favor, pngase en contacto con landry rieger llamando al [phone] y presione la opcin 4 o envenos un mensaje a travs de Clinical cytogeneticist.   No podemos decirle cul ser su copago por los medicamentos por adelantado ya que esto  es diferente dependiendo de la cobertura de su seguro. Sin embargo, es posible que podamos encontrar un medicamento sustituto a Audiological scientist un formulario para que el seguro cubra el medicamento que se considera necesario.   Si se requiere una autorizacin previa para que su compaa de seguros malta su medicamento, por favor permtanos de 1 a 2 das hbiles para completar este proceso.  Los precios de los medicamentos varan con frecuencia dependiendo del Environmental consultant de dnde se surte la receta y alguna farmacias pueden ofrecer precios ms baratos.  El sitio web www.goodrx.com tiene cupones para medicamentos de Health and safety inspector. Los precios aqu no tienen en cuenta lo que podra costar con la ayuda del seguro (puede ser ms barato con su seguro), pero el sitio web puede darle el precio si no utiliz Tourist information centre manager.  - Puede imprimir el cupn correspondiente y llevarlo con su receta a la farmacia.  - Tambin puede pasar por nuestra oficina durante el horario de atencin regular y Education officer, museum una tarjeta de cupones de  GoodRx.  - Si necesita que su receta se enve electrnicamente a una farmacia diferente, informe a nuestra oficina a travs de MyChart de [place] o por telfono llamando al [phone] y presione la opcin 4.

## 2024-03-26 NOTE — Progress Notes (Signed)
 Follow-Up Visit   Subjective  Rose Mendez is a 68 y.o. female who presents for the following: Skin Cancer Screening and Full Body Skin Exam. Hx of SCCs, SCCIS, Hx of BCCs. Hx of AKs. Hx of Basosquamous carcinoma.   The patient presents for Total-Body Skin Exam (TBSE) for skin cancer screening and mole check. The patient has spots, moles and lesions to be evaluated, some may be new or changing and the patient may have concern these could be cancer.  Patient was prescribed 5FU/Calcipotriene  and did not want to start using. Patient uses CeraVe lotion for rough and bumpy skin.  The following portions of the chart were reviewed this encounter and updated as appropriate: medications, allergies, medical history  Review of Systems:  No other skin or systemic complaints except as noted in HPI or Assessment and Plan.  Objective  Well appearing patient in no apparent distress; mood and affect are within normal limits.  A full examination was performed including scalp, head, eyes, ears, nose, lips, neck, chest, axillae, abdomen, back, buttocks, bilateral upper extremities, bilateral lower extremities, hands, feet, fingers, toes, fingernails, and toenails. All findings within normal limits unless otherwise noted below.   Relevant physical exam findings are noted in the Assessment and Plan.    Assessment & Plan   SKIN CANCER SCREENING PERFORMED TODAY.  ACTINIC DAMAGE WITH PRECANCEROUS ACTINIC KERATOSES Counseling for Topical Chemotherapy Management: Patient exhibits: - Severe, confluent actinic changes with pre-cancerous actinic keratoses that is secondary to cumulative UV radiation exposure over time - Condition that is severe; chronic, not at goal. - diffuse scaly erythematous macules and papules with underlying dyspigmentation - Discussed Prescription Field Treatment topical Chemotherapy for Severe, Chronic Confluent Actinic Changes with Pre-Cancerous Actinic Keratoses Field  treatment involves treatment of an entire area of skin that has confluent Actinic Changes (Sun/ Ultraviolet light damage) and PreCancerous Actinic Keratoses by method of PhotoDynamic Therapy (PDT) and/or prescription Topical Chemotherapy agents such as 5-fluorouracil , 5-fluorouracil /calcipotriene , and/or imiquimod.  The purpose is to decrease the number of clinically evident and subclinical PreCancerous lesions to prevent progression to development of skin cancer by chemically destroying early precancer changes that may or may not be visible.  It has been shown to reduce the risk of developing skin cancer in the treated area. As a result of treatment, redness, scaling, crusting, and open sores may occur during treatment course. One or more than one of these methods may be used and may have to be used several times to control, suppress and eliminate the PreCancerous changes. Discussed treatment course, expected reaction, and possible side effects. - Recommend daily broad spectrum sunscreen SPF 30+ to sun-exposed areas, reapply every 2 hours as needed.  - Staying in the shade or wearing long sleeves, sun glasses (UVA+UVB protection) and wide brim hats (4-inch brim around the entire circumference of the hat) are also recommended. - Call for new or changing lesions.  - discussed patient's concerned with field treatment. Emphasized that it will reduce risk of skin cancer and the need for procedures/surgeries - R FOREARM lesions are transitioning from HAK to early [facility]. Treatment is urgent or else surgery will be needed. Forearms have very little healthy skin and wound healing may be compromised. Patient agrees to trying field treatment - start 5-fluorouracil /calcipotriene  cream twice a day until irritated or reaction occurs to backs of hands, forearms and rough areas on chest. Patient states she will start in January. New prescriptions sent to [facility].   Reviewed course of treatment and  expected  reaction.  Patient advised to expect inflammation and crusting and advised that erosions are possible.  Patient advised to be diligent with sun protection during and after treatment. Handout with details of how to apply medication and what to expect provided. Counseled to keep medication out of reach of children and pets.  LENTIGINES, SEBORRHEIC KERATOSES, HEMANGIOMAS - Benign normal skin lesions - Benign-appearing - Call for any changes  MELANOCYTIC NEVI - Tan-brown and/or pink-flesh-colored symmetric macules and papules - Benign appearing on exam today - Observation - Call clinic for new or changing moles - Recommend daily use of broad spectrum spf 30+ sunscreen to sun-exposed areas.   HISTORY OF BASAL CELL CARCINOMA OF THE SKIN - No evidence of recurrence today - Recommend regular full body skin exams - Recommend daily broad spectrum sunscreen SPF 30+ to sun-exposed areas, reapply every 2 hours as needed.  - Call if any new or changing lesions are noted between office visits   HISTORY OF SQUAMOUS CELL CARCINOMA IN SITU OF THE SKIN - No evidence of recurrence today - Recommend regular full body skin exams - Recommend daily broad spectrum sunscreen SPF 30+ to sun-exposed areas, reapply every 2 hours as needed.  - Call if any new or changing lesions are noted between office visits    HISTORY OF SQUAMOUS CELL CARCINOMA OF THE SKIN - No evidence of recurrence today - No lymphadenopathy - Recommend regular full body skin exams - Recommend daily broad spectrum sunscreen SPF 30+ to sun-exposed areas, reapply every 2 hours as needed.  - Call if any new or changing lesions are noted between office visits  Ocular cicatricial pemphigoid  Exam per ophthalmology    10/13/22 [facility] Dr Demetria note - Sent by ophthalmology after having multiple eyelashes removed, has early evidence of scarring on lower conjunctiva. DIF non-specific with IgA and C3. - Patient feels like her eyelashes are  scratching her lower lids when she removes her contacts - 07/13/22 ophthalmology note (Dr. Sable): multiple exam findings of cicitriziation including sub tarsal fibrosis and fornix shortening - 09/20/22 ophthalmology note (Dr. Sable): Exam with stable scarring , mainly in the inferior fornix both eyes and the medial canthus , no new scarring    Reviewed 02/05/24 labs: CBC with Diff Hgb slightly low 11.4 but stable, CMP, Bilirubin Total and Direct, and Reticulocytes  01/08/24 AST ALT normal  Plan: - elevated reticulocytes and slightly lower RBC count are likely due to dapsone . HGB HCT Tbili and Dbili are normal and patient is asymptomatic so no action needed for now -Diagnosis, treatment options, prognosis, risk/ benefit, and side effects were discussed with the patient  -Continues having eyelashes epilated with ophthalmology. Tolerating dapsone  with no side effects. No dyspnea, bluish tint on skin, grip weakness - Continue dapsone  100 mg once daily.  - Patient advised we can follow her in our clinic and monitor blood work. If any complications, would recommend patient see Dr. Demetria at [facility]. Dr Soleimani 07/16/24 ophthalmology follow up. Emphasized patient must continue follow up with ophthalmology   SEBORRHEIC DERMATITIS Exam: Clear on exam today.   Chronic and persistent condition with duration or expected duration over one year. Condition is bothersome/symptomatic for patient. Currently flared.     Seborrheic Dermatitis is a chronic persistent rash characterized by pinkness and scaling most commonly of the mid face but also can occur on the scalp (dandruff), ears; mid chest, mid back and groin.  It tends to be exacerbated by stress and cooler weather.  People who have neurologic disease may experience new onset or exacerbation of existing seborrheic dermatitis.  The condition is not curable but treatable and can be controlled.   Treatment Plan: Continue OTC medicated shampoo as directed. 2-3  times per week, lather on scalp, leave on 5-8 minutes, rinse out.    Patient deferred treatment with topical corticosteroid at this time.     ANDROGENETIC ALOPECIA (FEMALE PATTERN HAIR LOSS) Exam: Diffuse thinning of the crown and widening of the midline part with retention of the frontal hairline   Chronic and persistent condition with duration or expected duration over one year. Condition is symptomatic / bothersome to patient. Not to goal.   Female Androgenic Alopecia is a chronic condition related to genetics and/or hormonal changes.  In women androgenetic alopecia is commonly associated with menopause but may occur any time after puberty.  It causes hair thinning primarily on the crown with widening of the part and temporal hairline recession.  Can use OTC Rogaine (minoxidil) 5% solution/foam as directed.  Oral treatments in female patients who have no contraindication may include : - Low dose oral minoxidil 1.25 - 5mg  daily - Spironolactone 50 - 100mg  bid - Finasteride 2.5 - 5 mg daily Adjunctive therapies include: - Low Level Laser Light Therapy (LLLT) - Platelet-rich plasma injections (PRP) - Hair Transplants or scalp reduction    Treatment Plan: Recommend using OTC Men's Rogaine 5%.  Avoiding spironolactone given patient's known hyperuricemia 07/03/23 labs and hx of gout   Recommend minoxidil 5% (Rogaine for men) solution or foam to be applied to the scalp and left in. This should ideally be used twice daily for best results but it helps with hair regrowth when used at least three times per week. Rogaine initially can cause increased hair shedding for the first few weeks but this will stop with continued use. In studies, people who used minoxidil (Rogaine) for at least 6 months had thicker hair than people who did not. Minoxidil topical (Rogaine) only works as long as it continues to be used. If if it is no longer used then the hair it has been helping to regrow can fall out. Minoxidil  topical (Rogaine) can cause increased facial hair growth which can usually be managed easily with a battery-operated hair trimmer. If facial hair growth is bothersome, switching to the 2% women's version can decrease the risk of unwanted facial hair growth.      Long term medication management.  Patient is using long term (months to years) prescription medication  to control their dermatologic condition.  These medications require periodic monitoring to evaluate for efficacy and side effects and may require periodic laboratory monitoring.   ACTINIC ELASTOSIS   ACTINIC KERATOSES   LENTIGINES   SEBORRHEIC KERATOSES   CHERRY ANGIOMA   MULTIPLE BENIGN NEVI   OCULAR CICATRICIAL PEMPHIGOID (HCC)   SEBORRHEIC DERMATITIS   FEMALE PATTERN HAIR LOSS   Return in about 6 months (around 09/23/2024) for w/ Dr. Claudene, TBSE.  IAlmetta Nora, RMA, am acting as scribe for Boneta Claudene, MD .   Documentation: I have reviewed the above documentation for accuracy and completeness, and I agree with the above.  Boneta Claudene, MD

## 2024-05-26 ENCOUNTER — Other Ambulatory Visit: Payer: Self-pay | Admitting: Dermatology

## 2024-05-29 ENCOUNTER — Encounter: Payer: Self-pay | Admitting: Dermatology

## 2024-05-29 NOTE — Telephone Encounter (Signed)
 Needs updated blood counts. Sent MyChart message

## 2024-05-31 ENCOUNTER — Other Ambulatory Visit: Payer: Self-pay | Admitting: Family Medicine

## 2024-05-31 DIAGNOSIS — Z1231 Encounter for screening mammogram for malignant neoplasm of breast: Secondary | ICD-10-CM

## 2024-06-19 ENCOUNTER — Encounter

## 2024-09-24 ENCOUNTER — Ambulatory Visit: Admitting: Dermatology
